# Patient Record
Sex: Female | Born: 1980 | Race: Black or African American | Hispanic: No | Marital: Married | Smoking: Never smoker
Health system: Southern US, Community
[De-identification: ages and names within clinical notes are randomized; demographics above are authoritative.]

## PROBLEM LIST (undated history)

## (undated) DIAGNOSIS — O1490 Unspecified pre-eclampsia, unspecified trimester: Secondary | ICD-10-CM

## (undated) DIAGNOSIS — A491 Streptococcal infection, unspecified site: Secondary | ICD-10-CM

## (undated) DIAGNOSIS — R001 Bradycardia, unspecified: Secondary | ICD-10-CM

## (undated) DIAGNOSIS — Z8669 Personal history of other diseases of the nervous system and sense organs: Secondary | ICD-10-CM

## (undated) DIAGNOSIS — D649 Anemia, unspecified: Secondary | ICD-10-CM

## (undated) DIAGNOSIS — Z8619 Personal history of other infectious and parasitic diseases: Secondary | ICD-10-CM

## (undated) DIAGNOSIS — O119 Pre-existing hypertension with pre-eclampsia, unspecified trimester: Secondary | ICD-10-CM

## (undated) HISTORY — DX: Anemia, unspecified: D64.9

## (undated) HISTORY — PX: NO PAST SURGERIES: SHX2092

## (undated) HISTORY — DX: Personal history of other diseases of the nervous system and sense organs: Z86.69

## (undated) HISTORY — DX: Personal history of other infectious and parasitic diseases: Z86.19

## (undated) HISTORY — DX: Streptococcal infection, unspecified site: A49.1

## (undated) HISTORY — DX: Pre-existing hypertension with pre-eclampsia, unspecified trimester: O11.9

---

## 2002-01-11 ENCOUNTER — Other Ambulatory Visit: Admission: RE | Admit: 2002-01-11 | Discharge: 2002-01-11 | Payer: Self-pay | Admitting: Obstetrics and Gynecology

## 2002-11-27 ENCOUNTER — Inpatient Hospital Stay (HOSPITAL_COMMUNITY): Admission: AD | Admit: 2002-11-27 | Discharge: 2002-11-29 | Payer: Self-pay | Admitting: Obstetrics and Gynecology

## 2003-08-06 ENCOUNTER — Other Ambulatory Visit: Admission: RE | Admit: 2003-08-06 | Discharge: 2003-08-06 | Payer: Self-pay | Admitting: Obstetrics and Gynecology

## 2004-09-13 ENCOUNTER — Other Ambulatory Visit: Admission: RE | Admit: 2004-09-13 | Discharge: 2004-09-13 | Payer: Self-pay | Admitting: Obstetrics and Gynecology

## 2005-11-04 ENCOUNTER — Other Ambulatory Visit: Admission: RE | Admit: 2005-11-04 | Discharge: 2005-11-04 | Payer: Self-pay | Admitting: Obstetrics and Gynecology

## 2005-12-05 DIAGNOSIS — A491 Streptococcal infection, unspecified site: Secondary | ICD-10-CM

## 2005-12-05 HISTORY — DX: Streptococcal infection, unspecified site: A49.1

## 2006-05-15 ENCOUNTER — Inpatient Hospital Stay (HOSPITAL_COMMUNITY): Admission: RE | Admit: 2006-05-15 | Discharge: 2006-05-17 | Payer: Self-pay | Admitting: Obstetrics and Gynecology

## 2006-06-10 ENCOUNTER — Emergency Department (HOSPITAL_COMMUNITY): Admission: EM | Admit: 2006-06-10 | Discharge: 2006-06-10 | Payer: Self-pay | Admitting: Emergency Medicine

## 2009-03-26 ENCOUNTER — Encounter: Admission: RE | Admit: 2009-03-26 | Discharge: 2009-03-26 | Payer: Self-pay | Admitting: Internal Medicine

## 2009-08-28 ENCOUNTER — Emergency Department (HOSPITAL_COMMUNITY): Admission: EM | Admit: 2009-08-28 | Discharge: 2009-08-29 | Payer: Self-pay | Admitting: Emergency Medicine

## 2009-12-29 ENCOUNTER — Emergency Department (HOSPITAL_COMMUNITY): Admission: EM | Admit: 2009-12-29 | Discharge: 2009-12-29 | Payer: Self-pay | Admitting: Emergency Medicine

## 2010-10-07 ENCOUNTER — Inpatient Hospital Stay (HOSPITAL_COMMUNITY): Admission: AD | Admit: 2010-10-07 | Discharge: 2010-10-07 | Payer: Self-pay | Admitting: Obstetrics and Gynecology

## 2010-10-22 ENCOUNTER — Inpatient Hospital Stay (HOSPITAL_COMMUNITY)
Admission: AD | Admit: 2010-10-22 | Discharge: 2010-10-22 | Payer: Self-pay | Source: Home / Self Care | Admitting: Obstetrics and Gynecology

## 2010-11-01 ENCOUNTER — Inpatient Hospital Stay (HOSPITAL_COMMUNITY)
Admission: AD | Admit: 2010-11-01 | Discharge: 2010-11-01 | Payer: Self-pay | Source: Home / Self Care | Admitting: Obstetrics and Gynecology

## 2010-11-05 ENCOUNTER — Inpatient Hospital Stay (HOSPITAL_COMMUNITY)
Admission: AD | Admit: 2010-11-05 | Discharge: 2010-11-07 | Payer: Self-pay | Source: Home / Self Care | Admitting: Obstetrics and Gynecology

## 2010-11-10 ENCOUNTER — Inpatient Hospital Stay (HOSPITAL_COMMUNITY)
Admission: AD | Admit: 2010-11-10 | Discharge: 2010-11-13 | Payer: Self-pay | Source: Home / Self Care | Attending: Obstetrics and Gynecology | Admitting: Obstetrics and Gynecology

## 2010-12-15 ENCOUNTER — Emergency Department (HOSPITAL_COMMUNITY)
Admission: EM | Admit: 2010-12-15 | Discharge: 2010-12-15 | Payer: Self-pay | Source: Home / Self Care | Admitting: Family Medicine

## 2010-12-20 LAB — HEPATIC FUNCTION PANEL
ALT: 22 U/L (ref 0–35)
AST: 22 U/L (ref 0–37)
Albumin: 3.9 g/dL (ref 3.5–5.2)
Alkaline Phosphatase: 68 U/L (ref 39–117)
Bilirubin, Direct: 0.1 mg/dL (ref 0.0–0.3)
Indirect Bilirubin: 0.7 mg/dL (ref 0.3–0.9)
Total Bilirubin: 0.8 mg/dL (ref 0.3–1.2)
Total Protein: 6.5 g/dL (ref 6.0–8.3)

## 2010-12-20 LAB — POCT I-STAT, CHEM 8
BUN: <3 mg/dL — ABNORMAL LOW (ref 6–23)
Calcium, Ion: 1.21 mmol/L (ref 1.12–1.32)
Chloride: 107 mEq/L (ref 96–112)
Creatinine, Ser: 0.9 mg/dL (ref 0.4–1.2)
Glucose, Bld: 83 mg/dL (ref 70–99)
HCT: 40 % (ref 36.0–46.0)
Hemoglobin: 13.6 g/dL (ref 12.0–15.0)
Potassium: 3.9 mEq/L (ref 3.5–5.1)
Sodium: 140 mEq/L (ref 135–145)
TCO2: 27 mmol/L (ref 0–100)

## 2010-12-20 LAB — POCT URINALYSIS DIPSTICK
Bilirubin Urine: NEGATIVE
Hgb urine dipstick: NEGATIVE
Ketones, ur: 15 mg/dL — AB
Nitrite: NEGATIVE
Protein, ur: NEGATIVE mg/dL
Specific Gravity, Urine: 1.015 (ref 1.005–1.030)
Urine Glucose, Fasting: NEGATIVE mg/dL
Urobilinogen, UA: 0.2 mg/dL (ref 0.0–1.0)
pH: 5.5 (ref 5.0–8.0)

## 2010-12-26 ENCOUNTER — Encounter: Payer: Self-pay | Admitting: Internal Medicine

## 2011-01-01 ENCOUNTER — Emergency Department (HOSPITAL_COMMUNITY)
Admission: EM | Admit: 2011-01-01 | Discharge: 2011-01-02 | Payer: Self-pay | Source: Home / Self Care | Admitting: Emergency Medicine

## 2011-01-01 LAB — CBC
HCT: 36.4 % (ref 36.0–46.0)
Hemoglobin: 11.5 g/dL — ABNORMAL LOW (ref 12.0–15.0)
MCH: 22 pg — ABNORMAL LOW (ref 26.0–34.0)
MCHC: 31.6 g/dL (ref 30.0–36.0)
MCV: 69.6 fL — ABNORMAL LOW (ref 78.0–100.0)
Platelets: 247 10*3/uL (ref 150–400)
RBC: 5.23 MIL/uL — ABNORMAL HIGH (ref 3.87–5.11)
RDW: 16.5 % — ABNORMAL HIGH (ref 11.5–15.5)
WBC: 6 10*3/uL (ref 4.0–10.5)

## 2011-01-02 LAB — URINE MICROSCOPIC-ADD ON

## 2011-01-02 LAB — BASIC METABOLIC PANEL
BUN: 3 mg/dL — ABNORMAL LOW (ref 6–23)
CO2: 22 mEq/L (ref 19–32)
Calcium: 9.4 mg/dL (ref 8.4–10.5)
Chloride: 109 mEq/L (ref 96–112)
Creatinine, Ser: 0.75 mg/dL (ref 0.4–1.2)
GFR calc Af Amer: >60 mL/min (ref >60)
GFR calc non Af Amer: >60 mL/min (ref >60)
Glucose, Bld: 84 mg/dL (ref 70–99)
Potassium: 3.9 mEq/L (ref 3.5–5.1)
Sodium: 141 mEq/L (ref 135–145)

## 2011-01-02 LAB — URINALYSIS, ROUTINE W REFLEX MICROSCOPIC
Bilirubin Urine: NEGATIVE
Ketones, ur: 15 mg/dL — AB
Leukocytes, UA: NEGATIVE
Nitrite: NEGATIVE
Protein, ur: NEGATIVE mg/dL
Specific Gravity, Urine: 1.008 (ref 1.005–1.030)
Urine Glucose, Fasting: NEGATIVE mg/dL
Urobilinogen, UA: 0.2 mg/dL (ref 0.0–1.0)
pH: 6.5 (ref 5.0–8.0)

## 2011-01-02 LAB — HEPATIC FUNCTION PANEL
ALT: 13 U/L (ref 0–35)
AST: 20 U/L (ref 0–37)
Albumin: 3.7 g/dL (ref 3.5–5.2)
Alkaline Phosphatase: 59 U/L (ref 39–117)
Bilirubin, Direct: 0.2 mg/dL (ref 0.0–0.3)
Indirect Bilirubin: 0.6 mg/dL (ref 0.3–0.9)
Total Bilirubin: 0.8 mg/dL (ref 0.3–1.2)
Total Protein: 6.2 g/dL (ref 6.0–8.3)

## 2011-01-02 LAB — DIFFERENTIAL
Basophils Absolute: 0 10*3/uL (ref 0.0–0.1)
Basophils Relative: 0 % (ref 0–1)
Eosinophils Absolute: 0.1 10*3/uL (ref 0.0–0.7)
Eosinophils Relative: 1 % (ref 0–5)
Lymphocytes Relative: 35 % (ref 12–46)
Lymphs Abs: 2.1 10*3/uL (ref 0.7–4.0)
Monocytes Absolute: 0.4 10*3/uL (ref 0.1–1.0)
Monocytes Relative: 6 % (ref 3–12)
Neutro Abs: 3.4 10*3/uL (ref 1.7–7.7)
Neutrophils Relative %: 58 % (ref 43–77)

## 2011-01-02 LAB — D-DIMER, QUANTITATIVE: D-Dimer, Quant: <0.22 ug/mL-FEU (ref 0.00–0.48)

## 2011-01-02 LAB — MAGNESIUM: Magnesium: 2.1 mg/dL (ref 1.5–2.5)

## 2011-01-11 NOTE — Discharge Summary (Signed)
  NAMEKaelani Curry, Michelle Curry                 ACCOUNT NO.:  [id number]  MEDICAL RECORD NO.:  [id number]          PATIENT TYPE:  INP  LOCATION:  9312                          FACILITY:  [facility]  PHYSICIAN:  Janine Limbo, M.D.DATE OF BIRTH:  10-04-1981  DATE OF ADMISSION:  11/10/2010 DATE OF DISCHARGE:  11/13/2010                              DISCHARGE SUMMARY   ADMITTING DIAGNOSES:  Status post spontaneous vaginal delivery 5 days ago, increased LFTs, edema, hyperreflexia, and mild increase in blood pressure.  DISCHARGE DIAGNOSES:  Postpartum preeclampsia questionable chronic hypertension, elevated uric acid, elevated ALT.  HOSPITAL PROCEDURES:  The patient was admitted to AICU per consult with Dr. Estanislado Pandy.  Magnesium sulfate 4 grams bolus and 2 grams an hour times 24 hours.  PIH labs were followed.  Strict I and O's and daily weights. blood pressure monitored.  CONDITION ON DISCHARGE:  The patient was stable.  DISCHARGE INSTRUCTIONS:  Follow up in office in 1 week, monitored for PIH symptoms.  DISCHARGE MEDICATIONS:  Motrin 600 mg, Dulcolax p.r.n., and prenatal vitamins daily.  DISCHARGE FOLLOWUP:  As stated above.  The patient was deemed to receive full benefit of her hospital stay and was discharged home in stable condition.    ______________________________ Sanda Klein, CNM   ______________________________ Janine Limbo, M.D.    SL/MEDQ  D:  01/05/2011  T:  01/06/2011  Job:  [id number]  Electronically Signed by Sanda Klein CNM on 01/10/2011 08:30:05 AM Electronically Signed by Kirkland Hun M.D. on 01/11/2011 09:58:29 PM

## 2011-02-14 LAB — COMPREHENSIVE METABOLIC PANEL
ALT: 139 U/L — ABNORMAL HIGH (ref 0–35)
ALT: 163 U/L — ABNORMAL HIGH (ref 0–35)
ALT: 185 U/L — ABNORMAL HIGH (ref 0–35)
AST: 109 U/L — ABNORMAL HIGH (ref 0–37)
AST: 128 U/L — ABNORMAL HIGH (ref 0–37)
Albumin: 2.5 g/dL — ABNORMAL LOW (ref 3.5–5.2)
Albumin: 2.6 g/dL — ABNORMAL LOW (ref 3.5–5.2)
Albumin: 3.1 g/dL — ABNORMAL LOW (ref 3.5–5.2)
Alkaline Phosphatase: 100 U/L (ref 39–117)
Alkaline Phosphatase: 106 U/L (ref 39–117)
Alkaline Phosphatase: 115 U/L (ref 39–117)
BUN: 10 mg/dL (ref 6–23)
BUN: 7 mg/dL (ref 6–23)
BUN: 8 mg/dL (ref 6–23)
CO2: 23 mEq/L (ref 19–32)
CO2: 25 mEq/L (ref 19–32)
Calcium: 8 mg/dL — ABNORMAL LOW (ref 8.4–10.5)
Calcium: 8.8 mg/dL (ref 8.4–10.5)
Chloride: 107 mEq/L (ref 96–112)
Chloride: 107 mEq/L (ref 96–112)
Chloride: 107 mEq/L (ref 96–112)
Creatinine, Ser: 0.66 mg/dL (ref 0.4–1.2)
GFR calc Af Amer: >60 mL/min (ref >60)
GFR calc non Af Amer: >60 mL/min (ref >60)
GFR calc non Af Amer: >60 mL/min (ref >60)
Glucose, Bld: 131 mg/dL — ABNORMAL HIGH (ref 70–99)
Glucose, Bld: 74 mg/dL (ref 70–99)
Glucose, Bld: 84 mg/dL (ref 70–99)
Potassium: 3.7 mEq/L (ref 3.5–5.1)
Potassium: 4.1 mEq/L (ref 3.5–5.1)
Potassium: 4.2 mEq/L (ref 3.5–5.1)
Sodium: 136 mEq/L (ref 135–145)
Sodium: 138 mEq/L (ref 135–145)
Sodium: 138 mEq/L (ref 135–145)
Total Bilirubin: 0.3 mg/dL (ref 0.3–1.2)
Total Bilirubin: 0.4 mg/dL (ref 0.3–1.2)
Total Protein: 5.1 g/dL — ABNORMAL LOW (ref 6.0–8.3)
Total Protein: 5.1 g/dL — ABNORMAL LOW (ref 6.0–8.3)
Total Protein: 5.6 g/dL — ABNORMAL LOW (ref 6.0–8.3)

## 2011-02-14 LAB — CBC
HCT: 30 % — ABNORMAL LOW (ref 36.0–46.0)
HCT: 30.6 % — ABNORMAL LOW (ref 36.0–46.0)
HCT: 32.8 % — ABNORMAL LOW (ref 36.0–46.0)
Hemoglobin: 10.4 g/dL — ABNORMAL LOW (ref 12.0–15.0)
Hemoglobin: 9.5 g/dL — ABNORMAL LOW (ref 12.0–15.0)
Hemoglobin: 9.7 g/dL — ABNORMAL LOW (ref 12.0–15.0)
MCH: 22.3 pg — ABNORMAL LOW (ref 26.0–34.0)
MCH: 22.5 pg — ABNORMAL LOW (ref 26.0–34.0)
MCH: 22.7 pg — ABNORMAL LOW (ref 26.0–34.0)
MCHC: 31.6 g/dL (ref 30.0–36.0)
MCHC: 31.7 g/dL (ref 30.0–36.0)
MCHC: 31.8 g/dL (ref 30.0–36.0)
MCV: 70.6 fL — ABNORMAL LOW (ref 78.0–100.0)
MCV: 70.7 fL — ABNORMAL LOW (ref 78.0–100.0)
MCV: 71.4 fL — ABNORMAL LOW (ref 78.0–100.0)
MCV: 71.9 fL — ABNORMAL LOW (ref 78.0–100.0)
Platelets: 141 10*3/uL — ABNORMAL LOW (ref 150–400)
Platelets: 159 10*3/uL (ref 150–400)
Platelets: 228 10*3/uL (ref 150–400)
Platelets: 246 10*3/uL (ref 150–400)
Platelets: 294 10*3/uL (ref 150–400)
RBC: 4.24 MIL/uL (ref 3.87–5.11)
RBC: 4.24 MIL/uL (ref 3.87–5.11)
RBC: 4.28 MIL/uL (ref 3.87–5.11)
RBC: 4.64 MIL/uL (ref 3.87–5.11)
RDW: 16 % — ABNORMAL HIGH (ref 11.5–15.5)
RDW: 16.4 % — ABNORMAL HIGH (ref 11.5–15.5)
RDW: 17.3 % — ABNORMAL HIGH (ref 11.5–15.5)
RDW: 17.5 % — ABNORMAL HIGH (ref 11.5–15.5)
RDW: 18 % — ABNORMAL HIGH (ref 11.5–15.5)
WBC: 10.2 10*3/uL (ref 4.0–10.5)
WBC: 10.3 10*3/uL (ref 4.0–10.5)
WBC: 11 10*3/uL — ABNORMAL HIGH (ref 4.0–10.5)
WBC: 15.3 10*3/uL — ABNORMAL HIGH (ref 4.0–10.5)
WBC: 8.6 10*3/uL (ref 4.0–10.5)

## 2011-02-14 LAB — URINALYSIS, MICROSCOPIC ONLY
Bilirubin Urine: NEGATIVE
Glucose, UA: NEGATIVE mg/dL
Hgb urine dipstick: NEGATIVE
Ketones, ur: NEGATIVE mg/dL
Leukocytes, UA: NEGATIVE
Nitrite: NEGATIVE
Protein, ur: NEGATIVE mg/dL
Specific Gravity, Urine: 1.01 (ref 1.005–1.030)
Urobilinogen, UA: 0.2 mg/dL (ref 0.0–1.0)
pH: 5.5 (ref 5.0–8.0)

## 2011-02-14 LAB — URIC ACID
Uric Acid, Serum: 6.8 mg/dL (ref 2.4–7.0)
Uric Acid, Serum: 7.1 mg/dL — ABNORMAL HIGH (ref 2.4–7.0)
Uric Acid, Serum: 7.1 mg/dL — ABNORMAL HIGH (ref 2.4–7.0)

## 2011-02-14 LAB — RPR: RPR Ser Ql: NONREACTIVE

## 2011-02-14 LAB — LACTATE DEHYDROGENASE: LDH: 275 U/L — ABNORMAL HIGH (ref 94–250)

## 2011-02-14 LAB — MRSA PCR SCREENING: MRSA by PCR: NEGATIVE

## 2011-02-20 LAB — COMPREHENSIVE METABOLIC PANEL
ALT: 15 U/L (ref 0–35)
BUN: 4 mg/dL — ABNORMAL LOW (ref 6–23)
CO2: 23 mEq/L (ref 19–32)
Calcium: 9.5 mg/dL (ref 8.4–10.5)
Creatinine, Ser: 0.72 mg/dL (ref 0.4–1.2)
GFR calc non Af Amer: >60 mL/min (ref >60)
Glucose, Bld: 78 mg/dL (ref 70–99)
Sodium: 137 mEq/L (ref 135–145)
Total Protein: 6.8 g/dL (ref 6.0–8.3)

## 2011-02-20 LAB — URINALYSIS, ROUTINE W REFLEX MICROSCOPIC
Nitrite: NEGATIVE
Specific Gravity, Urine: 1.009 (ref 1.005–1.030)
pH: 6 (ref 5.0–8.0)

## 2011-02-20 LAB — HEMOCCULT GUIAC POC 1CARD (OFFICE): Fecal Occult Bld: NEGATIVE

## 2011-02-20 LAB — DIFFERENTIAL
Eosinophils Absolute: 0 10*3/uL (ref 0.0–0.7)
Lymphs Abs: 2.6 10*3/uL (ref 0.7–4.0)
Monocytes Relative: 5 % (ref 3–12)
Neutro Abs: 6.4 10*3/uL (ref 1.7–7.7)
Neutrophils Relative %: 67 % (ref 43–77)

## 2011-02-20 LAB — CBC
HCT: 36.8 % (ref 36.0–46.0)
Hemoglobin: 12 g/dL (ref 12.0–15.0)
MCHC: 32.5 g/dL (ref 30.0–36.0)
MCV: 73.7 fL — ABNORMAL LOW (ref 78.0–100.0)
RDW: 13.8 % (ref 11.5–15.5)
WBC: 9.6 10*3/uL (ref 4.0–10.5)

## 2011-02-20 LAB — POCT PREGNANCY, URINE: Preg Test, Ur: NEGATIVE

## 2011-02-20 LAB — LIPASE, BLOOD: Lipase: 19 U/L (ref 11–59)

## 2011-04-22 NOTE — H&P (Signed)
NAMEHanifah Curry, Michelle Curry                 ACCOUNT NO.:  [id number]   MEDICAL RECORD NO.:  [id number]          PATIENT TYPE:  INP   LOCATION:  9165                          [facility]ILITY:  [facility]   PHYSICIAN:  Dois Davenport A. Rivard, M.D. DATE OF BIRTH:  Apr 26, 1981   DATE OF ADMISSION:  05/15/2006  DATE OF DISCHARGE:                                HISTORY & PHYSICAL   Michelle Curry  is a 30 year old gravida 2, para 1-0-0-1 who presents at 38-6/7  weeks with advanced cervical dilation for elective induction of labor.  Her  pregnancy has been followed by the MD service and is remarkable for  1.  Heart murmur.  2.  Migraine headaches.  3.  Group B strep positive.   Michelle Curry entered prenatal care at [redacted] weeks gestation as determined by  dates and confirmed with pregnancy ultrasound.  Her pregnancy has been  essentially unremarkable.  She has been size equal to dates throughout,  normotensive with no proteinuria.  Her last ultrasound was at 36 weeks with  estimated fetal weight at the 84th percentile.   PRENATAL LAB WORK:  On November 04, 2005, hemoglobin and hematocrit 12 and  37.3, platelets 304,000.  Blood type and Rh A+, antibody screen negative,  sickle cell trait negative, VDRL nonreactive, rubella immune, hepatitis B  surface antigen negative, HIV nonreactive.  Pap smear within normal limits.  GC and chlamydia negative.  CF testing negative.  Quad screen negative at 28  weeks.  One hour glucose challenge test was within normal limits.  Hemoglobin 11.5 at 36 weeks.  Culture of the vaginal tract is positive for  group B Strep, negative for GC and chlamydia.   OBSTETRIC HISTORY:  In 2003, the patient had a normal spontaneous vaginal  delivery at term with the birth of an 8 pound 1 ounce female infant named  Michelle Curry with no complications.  This is her second and current pregnancy.   MEDICAL HISTORY:  1.  Heart murmur with dental prophylaxis.  2.  Migraine headaches.   FAMILY HISTORY:   Unremarkable.   GENETIC HISTORY:  There is no family history of familial or chromosomal  disorders, children that were born with any birth defects or any that died  in infancy.   ALLERGIES:  The patient has no known drug allergies.   She denies the use of tobacco, alcohol or illicit drugs.   REVIEW OF SYSTEMS:  There are no signs or symptoms suggestive of focal or  systemic disease and the patient is typical of one with the uterine  pregnancy at term for induction of labor with advanced cervical dilation.   PHYSICAL EXAMINATION:  VITAL SIGNS:  Stable.  The patient is afebrile.  HEENT:  Unremarkable.  HEART:  Regular rate and rhythm.  LUNGS:  Clear.  ABDOMEN:  Abdomen is gravid in its contour.  Uterine fundus is noted to  extend 38 cm above the level of the pubic symphysis.  Leopold's maneuvers  finds the infant to be a longitudinal lie, cephalic presentation and the  estimated fetal weight is 7-1/2 to 8 pounds.  PELVIC:  Digital exam of the cervix finds it to be 4 centimeters dilated 75%  effaced with the cephalic presenting part at -2 station  EXTREMITIES:  No pathologic edema.  DTRs were 1+ with no clonus.  There is  no calf tenderness bilaterally.   ASSESSMENT:  1.  Intrauterine pregnancy at term.  2.  Induction of labor with advanced cervical dilation.   PLAN:  Admit per Dr. Silverio Lay, routine MD orders and orders as written  by Dr. Estanislado Pandy.  Anticipate spontaneous vaginal delivery.      Rica Koyanagi, C.N.M.      Crist Fat Rivard, M.D.  Electronically Signed    SDM/MEDQ  D:  05/15/2006  T:  05/15/2006  Job:  [id number]

## 2011-04-22 NOTE — H&P (Signed)
NAME:  Janera, Peugh Madalyne C                           ACCOUNT NO.:  [id number]   MEDICAL RECORD NO.:  [id number]                   PATIENT TYPE:  MAT   LOCATION:  MATC                                 [facility]ILITY:  [facility]   PHYSICIAN:  Crist Fat. Rivard, M.D.              DATE OF BIRTH:  [birth year]   DATE OF ADMISSION:  11/27/2002  DATE OF DISCHARGE:                                HISTORY & PHYSICAL   HISTORY OF PRESENT ILLNESS:  The patient is a 30 year old gravida 1, para 0  at 39-1/7th weeks who presents today with uterine contractions every two to  three minutes for the last several hours.  She denies vaginal bleeding or  leaking of fluid.  She reports positive fetal movement.  Pregnancy has been  remarkable for:  1. First trimester spotting.  2. History of heart murmur with prophylactic antibiotics for dental work.  3. Conception on OCP's.  4. History of migraines.   LABORATORY DATA:  Prenatal labs show blood type A positive, Rh antibody  negative, VDRL nonreactive, Rubella titer positive, hepatitis B surface  antigen negative, HIV negative, sickle cell test negative, GC and Chlamydia  cultures were negative.  Pap smear was normal.  Glucose challenge was  normal.  AFP was normal.  Hemoglobin upon entering the practice was 12.7 and  was 11.5 at 28 weeks.  B Strep culture was negative at 36 weeks.  [facility]  December 03, 2002 was established by ultrasound at 8 weeks.   HISTORY OF PRESENT PREGNANCY:  Patient entered care at approximately 9  weeks.  She had an ultrasound that dated her at approximately 8 weeks.  She  had conceived on OCP's and there was uncertain LMP data.  She had some  bleeding early.  She had an ultrasound at 18 weeks that showed normal growth  and development.  Her Glucola was normal.  She had an impacted and infected  wisdom tooth that was treated by her dentist at 28 weeks.  The rest of her  pregnancy was essentially uncomplicated.  She was 2 cm in the office  yesterday.   OBSTETRICAL HISTORY:  Patient is primigravida.   PAST MEDICAL HISTORY:  Patient has a history of heart murmur, takes  antibiotics for dental work.  The patient has a history of anemia three  years ago.  She has had occasional yeast infections.  She conceived on  OCP's.  She reports usual childhood illnesses.   ALLERGIES:  Patient has no known allergies.   FAMILY HISTORY:  Mother has history of anemia.  Her father has a history of  migraine headaches. Genetic history remarkable for patient's first cousin  having twins and father of the baby's paternal aunt is a twin.   SOCIAL HISTORY:  Patient is married to the father of the baby.  He is  involved and supportive.  His name is Leomia Blake.  The patient is  African-American female and of Hollye Pritt.  She has been followed by  the physician service of [facility].  She denies any alcohol, drug  or tobacco use during this pregnancy.  She has 14+ years, college, is  employed in [employer].  Her partner has one year of college.  He is employed in a [employer].   PHYSICAL EXAMINATION:  VITAL SIGNS:  Stable.  Patient is afebrile.  HEENT:  Within normal limits.  LUNGS:  Bilateral breath sounds are clear.  HEART:  Regular rate and rhythm without murmurs.  BREASTS:  Soft, nontender.  ABDOMEN:  Fundal height is approximately 38 cm.  Estimated fetal weight is 7  to 7.5 pounds.  Uterine contractions every two to three minutes.  Moderate  quality.  Cervical examination is 3 to 4 cm, 80% vertex, at a -1 station  with intact bag of water.  Fetal heart rate is reassuring with a negative  spontaneous CST.  There are occasional mild variables noted.  EXTREMITIES:  Deep tendon reflexes are 2+ without clonus.  There is a trace  edema noted.   IMPRESSION:  1. Intrauterine pregnancy at 39-1/7th weeks.  2. Early labor.   PLAN:  1. Admit to birthing suite for consultation with Dr. Silverio Lay as     attending  physician.  2. Routine physician orders.  3. Patient plans epidural as labor progresses.     Renaldo Reel Emilee Hero, C.N.M.                   Crist Fat Rivard, M.D.    Leeanne Mannan  D:  11/27/2002  T:  11/27/2002  Job:  [id number]

## 2011-08-31 ENCOUNTER — Other Ambulatory Visit: Payer: Self-pay | Admitting: Oncology

## 2011-08-31 ENCOUNTER — Encounter: Payer: Self-pay | Admitting: Oncology

## 2011-08-31 ENCOUNTER — Encounter (HOSPITAL_BASED_OUTPATIENT_CLINIC_OR_DEPARTMENT_OTHER): Payer: BC Managed Care – PPO | Admitting: Oncology

## 2011-08-31 DIAGNOSIS — D6489 Other specified anemias: Secondary | ICD-10-CM

## 2011-08-31 LAB — COMPREHENSIVE METABOLIC PANEL
Albumin: 3.9 g/dL (ref 3.5–5.2)
BUN: 8 mg/dL (ref 6–23)
Calcium: 9.4 mg/dL (ref 8.4–10.5)
Chloride: 105 mEq/L (ref 96–112)
Creatinine, Ser: 0.66 mg/dL (ref 0.50–1.10)
Glucose, Bld: 85 mg/dL (ref 70–99)
Potassium: 3.8 mEq/L (ref 3.5–5.3)

## 2011-08-31 LAB — IRON AND TIBC
Iron: 37 ug/dL — ABNORMAL LOW (ref 42–145)
TIBC: 285 ug/dL (ref 250–470)
UIBC: 248 ug/dL (ref 125–400)

## 2011-08-31 LAB — CBC & DIFF AND RETIC
BASO%: 0.1 % (ref 0.0–2.0)
EOS%: 0.5 % (ref 0.0–7.0)
Immature Retic Fract: 3.4 % (ref 1.60–10.00)
MCHC: 31.9 g/dL (ref 31.5–36.0)
MONO#: 0.5 10*3/uL (ref 0.1–0.9)
RBC: 5.09 10*6/uL (ref 3.70–5.45)
Retic %: 1.68 % (ref 0.70–2.10)
WBC: 8.9 10*3/uL (ref 3.9–10.3)
lymph#: 1.7 10*3/uL (ref 0.9–3.3)

## 2011-08-31 LAB — MORPHOLOGY

## 2011-08-31 LAB — CHCC SMEAR

## 2011-09-02 LAB — HEMOGLOBINOPATHY EVALUATION
Hemoglobin Other: 0 % (ref 0.0–0.0)
Hgb F Quant: 0 % (ref 0.0–2.0)
Hgb S Quant: 0 % (ref 0.0–0.0)

## 2011-10-28 ENCOUNTER — Other Ambulatory Visit: Payer: BC Managed Care – PPO | Admitting: Lab

## 2011-12-06 NOTE — L&D Delivery Note (Signed)
Delivery Note Pt received epidural shortly after arrival to [facility].  AROM at 1116 and pt 8-9 cm.  Labored progressed thereafter to complete at 1129.  Pushed well to SVD at 11:49 AM. A viable female "Hulan Amato" was delivered via Vaginal, Spontaneous Delivery (Presentation: ;ROA  ).  APGAR: 9, 9; weight 8 lb 7.1 oz (3830 g).   Placenta status: Intact, Spontaneous, Schultz.  Cord: 3 vessels with the following complications: None.  Cord pH: n/a.  Anesthesia: Epidural  Episiotomy: None Lacerations: None Suture Repair: n/a Est. Blood Loss (mL): 150  Mom to postpartum.  Baby to nursery-stable.  Eron Staat H 06/13/2012, 5:46 PM

## 2011-12-07 DIAGNOSIS — Q688 Other specified congenital musculoskeletal deformities: Secondary | ICD-10-CM | POA: Insufficient documentation

## 2011-12-07 DIAGNOSIS — R011 Cardiac murmur, unspecified: Secondary | ICD-10-CM | POA: Insufficient documentation

## 2011-12-07 DIAGNOSIS — G43909 Migraine, unspecified, not intractable, without status migrainosus: Secondary | ICD-10-CM | POA: Insufficient documentation

## 2011-12-07 LAB — OB RESULTS CONSOLE RPR: RPR: NONREACTIVE

## 2011-12-07 LAB — OB RESULTS CONSOLE HIV ANTIBODY (ROUTINE TESTING): HIV: NONREACTIVE

## 2011-12-07 LAB — OB RESULTS CONSOLE ABO/RH: RH Type: POSITIVE

## 2011-12-07 LAB — OB RESULTS CONSOLE HEPATITIS B SURFACE ANTIGEN: Hepatitis B Surface Ag: NEGATIVE

## 2011-12-08 DIAGNOSIS — K469 Unspecified abdominal hernia without obstruction or gangrene: Secondary | ICD-10-CM | POA: Insufficient documentation

## 2012-02-02 ENCOUNTER — Encounter (INDEPENDENT_AMBULATORY_CARE_PROVIDER_SITE_OTHER): Payer: BC Managed Care – PPO | Admitting: Obstetrics and Gynecology

## 2012-02-02 ENCOUNTER — Other Ambulatory Visit: Payer: BC Managed Care – PPO

## 2012-02-02 DIAGNOSIS — Z1389 Encounter for screening for other disorder: Secondary | ICD-10-CM

## 2012-03-08 ENCOUNTER — Encounter (INDEPENDENT_AMBULATORY_CARE_PROVIDER_SITE_OTHER): Payer: BC Managed Care – PPO | Admitting: Obstetrics and Gynecology

## 2012-03-08 DIAGNOSIS — O358XX0 Maternal care for other (suspected) fetal abnormality and damage, not applicable or unspecified: Secondary | ICD-10-CM

## 2012-03-09 ENCOUNTER — Other Ambulatory Visit: Payer: Self-pay

## 2012-03-09 DIAGNOSIS — Z3400 Encounter for supervision of normal first pregnancy, unspecified trimester: Secondary | ICD-10-CM

## 2012-04-04 DIAGNOSIS — Q688 Other specified congenital musculoskeletal deformities: Secondary | ICD-10-CM

## 2012-04-04 DIAGNOSIS — D649 Anemia, unspecified: Secondary | ICD-10-CM | POA: Insufficient documentation

## 2012-04-04 DIAGNOSIS — G43909 Migraine, unspecified, not intractable, without status migrainosus: Secondary | ICD-10-CM

## 2012-04-04 DIAGNOSIS — K469 Unspecified abdominal hernia without obstruction or gangrene: Secondary | ICD-10-CM

## 2012-04-04 DIAGNOSIS — R011 Cardiac murmur, unspecified: Secondary | ICD-10-CM

## 2012-04-05 ENCOUNTER — Ambulatory Visit (INDEPENDENT_AMBULATORY_CARE_PROVIDER_SITE_OTHER): Payer: Self-pay | Admitting: Obstetrics and Gynecology

## 2012-04-05 ENCOUNTER — Other Ambulatory Visit: Payer: Self-pay

## 2012-04-05 ENCOUNTER — Encounter: Payer: Self-pay | Admitting: Obstetrics and Gynecology

## 2012-04-05 VITALS — BP 100/62 | Ht 63.0 in | Wt 176.0 lb

## 2012-04-05 DIAGNOSIS — O26849 Uterine size-date discrepancy, unspecified trimester: Secondary | ICD-10-CM

## 2012-04-05 DIAGNOSIS — Z3490 Encounter for supervision of normal pregnancy, unspecified, unspecified trimester: Secondary | ICD-10-CM

## 2012-04-05 DIAGNOSIS — Z3480 Encounter for supervision of other normal pregnancy, unspecified trimester: Secondary | ICD-10-CM

## 2012-04-05 NOTE — Progress Notes (Signed)
No complaints U/s for EFW at [facility] secondary S>D Glucola today [facility]

## 2012-04-06 LAB — CBC
Hemoglobin: 11.8 g/dL — ABNORMAL LOW (ref 12.0–15.0)
MCH: 23.2 pg — ABNORMAL LOW (ref 26.0–34.0)
MCHC: 30.8 g/dL (ref 30.0–36.0)
MCV: 75.4 fL — ABNORMAL LOW (ref 78.0–100.0)
Platelets: 215 10*3/uL (ref 150–400)

## 2012-04-06 LAB — GLUCOSE TOLERANCE, 1 HOUR: Glucose, 1 Hour GTT: 108 mg/dL (ref 70–140)

## 2012-04-17 ENCOUNTER — Other Ambulatory Visit: Payer: BC Managed Care – PPO

## 2012-04-17 ENCOUNTER — Ambulatory Visit (INDEPENDENT_AMBULATORY_CARE_PROVIDER_SITE_OTHER): Payer: BC Managed Care – PPO | Admitting: Obstetrics and Gynecology

## 2012-04-17 ENCOUNTER — Other Ambulatory Visit: Payer: Self-pay | Admitting: Obstetrics and Gynecology

## 2012-04-17 VITALS — BP 110/64 | Wt 182.0 lb

## 2012-04-17 DIAGNOSIS — O26849 Uterine size-date discrepancy, unspecified trimester: Secondary | ICD-10-CM

## 2012-04-17 DIAGNOSIS — Z3689 Encounter for other specified antenatal screening: Secondary | ICD-10-CM

## 2012-04-17 NOTE — Progress Notes (Signed)
The patient desires permanent sterilization.  Her husband declines vasectomy.  Postpartum BTL discussed. Glucola 108.  Hemoglobin 11.8.  RPR nonreactive. Ultrasound rescheduled for tomorrow.  We will measure the patient's cervix at that time.  She declines pelvic exam today. CNM will discuss ultrasound results with the patient at the hospital tomorrow. Return office in 2 weeks.  Dr. Stefano Gaul

## 2012-04-17 NOTE — Progress Notes (Signed)
Pt c/o of [employer]

## 2012-04-18 ENCOUNTER — Ambulatory Visit (HOSPITAL_COMMUNITY)
Admission: RE | Admit: 2012-04-18 | Discharge: 2012-04-18 | Disposition: A | Discharge status: Home / Self Care | Payer: BC Managed Care – PPO | Source: Ambulatory Visit | Attending: Obstetrics and Gynecology | Admitting: Obstetrics and Gynecology

## 2012-04-18 DIAGNOSIS — Z3689 Encounter for other specified antenatal screening: Secondary | ICD-10-CM | POA: Insufficient documentation

## 2012-04-18 DIAGNOSIS — O3660X0 Maternal care for excessive fetal growth, unspecified trimester, not applicable or unspecified: Secondary | ICD-10-CM | POA: Insufficient documentation

## 2012-05-03 ENCOUNTER — Ambulatory Visit (INDEPENDENT_AMBULATORY_CARE_PROVIDER_SITE_OTHER): Payer: Self-pay | Admitting: Obstetrics and Gynecology

## 2012-05-03 ENCOUNTER — Encounter: Payer: Self-pay | Admitting: Obstetrics and Gynecology

## 2012-05-03 VITALS — BP 110/60 | Wt 187.0 lb

## 2012-05-03 DIAGNOSIS — Z331 Pregnant state, incidental: Secondary | ICD-10-CM

## 2012-05-03 NOTE — Patient Instructions (Signed)
Patient Education Materials to be provided at check out (*indicates is located in accordion folder):  Easing Back Pain During Pregnancy  

## 2012-05-03 NOTE — Progress Notes (Signed)
No LOf or vb fkc reviewed Femur length small recheck at 36 weeks with growth Pt having trouble sleeping and back pain comfort and sleep measures given to pt

## 2012-05-03 NOTE — Progress Notes (Signed)
Pt c/o lower back pain and leg cramps.

## 2012-05-15 ENCOUNTER — Ambulatory Visit (INDEPENDENT_AMBULATORY_CARE_PROVIDER_SITE_OTHER): Payer: Self-pay | Admitting: Obstetrics and Gynecology

## 2012-05-15 VITALS — BP 102/58 | Wt 187.0 lb

## 2012-05-15 DIAGNOSIS — IMO0002 Reserved for concepts with insufficient information to code with codable children: Secondary | ICD-10-CM

## 2012-05-15 DIAGNOSIS — Z331 Pregnant state, incidental: Secondary | ICD-10-CM

## 2012-05-15 NOTE — Progress Notes (Signed)
Pt stated having lower pressure feels like the baby is coming out . No other issues today .

## 2012-05-15 NOTE — Progress Notes (Signed)
C/o increased pressure and contractions Suggest maternity belt S>D ultrasound at [facility]

## 2012-05-22 ENCOUNTER — Ambulatory Visit (INDEPENDENT_AMBULATORY_CARE_PROVIDER_SITE_OTHER): Payer: Self-pay | Admitting: Obstetrics and Gynecology

## 2012-05-22 ENCOUNTER — Ambulatory Visit (INDEPENDENT_AMBULATORY_CARE_PROVIDER_SITE_OTHER): Payer: Self-pay

## 2012-05-22 ENCOUNTER — Encounter: Payer: Self-pay | Admitting: Obstetrics and Gynecology

## 2012-05-22 VITALS — BP 100/60 | Wt 190.0 lb

## 2012-05-22 DIAGNOSIS — IMO0002 Reserved for concepts with insufficient information to code with codable children: Secondary | ICD-10-CM

## 2012-05-22 DIAGNOSIS — Z331 Pregnant state, incidental: Secondary | ICD-10-CM

## 2012-05-22 DIAGNOSIS — O3660X0 Maternal care for excessive fetal growth, unspecified trimester, not applicable or unspecified: Secondary | ICD-10-CM

## 2012-05-22 NOTE — Progress Notes (Addendum)
C/o seeing a spot of blood on Sat U/S 7lbs 13oz (3537) greater 97%, AFI 19cm, FHR 131, vtx, ant placenta, f/u u/s in 3wks for EFW, BPP 8/8 Reviewed LGA nd poss for recs for c/s depending on wt at next u/s in 3wks Spec no blood in vault, GBS/GC/CT today with consent FKCs and diet recs reviewed Questions answered RTO in 1wk

## 2012-05-23 LAB — GC/CHLAMYDIA PROBE AMP, GENITAL: GC Probe Amp, Genital: NEGATIVE

## 2012-05-28 ENCOUNTER — Encounter: Payer: Self-pay | Admitting: Obstetrics and Gynecology

## 2012-05-28 ENCOUNTER — Ambulatory Visit (INDEPENDENT_AMBULATORY_CARE_PROVIDER_SITE_OTHER): Payer: Self-pay | Admitting: Obstetrics and Gynecology

## 2012-05-28 VITALS — BP 100/58 | Wt 191.0 lb

## 2012-05-28 DIAGNOSIS — Z331 Pregnant state, incidental: Secondary | ICD-10-CM

## 2012-05-28 NOTE — Progress Notes (Signed)
GBS, GC, Chlamydia negative Plan sono in 2 weeks

## 2012-05-28 NOTE — Progress Notes (Signed)
Pt stated she thinks she lost her mucous  plug last week. Pt no issues today / pt wants cervix check today .

## 2012-06-04 ENCOUNTER — Ambulatory Visit (INDEPENDENT_AMBULATORY_CARE_PROVIDER_SITE_OTHER): Payer: Self-pay | Admitting: Obstetrics and Gynecology

## 2012-06-04 ENCOUNTER — Encounter: Payer: Self-pay | Admitting: Obstetrics and Gynecology

## 2012-06-04 VITALS — BP 112/60 | Wt 190.0 lb

## 2012-06-04 DIAGNOSIS — Z331 Pregnant state, incidental: Secondary | ICD-10-CM

## 2012-06-04 NOTE — Progress Notes (Signed)
Increased pressure No LOF  No bleeding C/O chest heaviness

## 2012-06-04 NOTE — Progress Notes (Signed)
C/O of a tiredness in chest Desires cervix check

## 2012-06-11 ENCOUNTER — Ambulatory Visit (INDEPENDENT_AMBULATORY_CARE_PROVIDER_SITE_OTHER): Payer: Self-pay

## 2012-06-11 DIAGNOSIS — O3660X0 Maternal care for excessive fetal growth, unspecified trimester, not applicable or unspecified: Secondary | ICD-10-CM

## 2012-06-11 DIAGNOSIS — Z331 Pregnant state, incidental: Secondary | ICD-10-CM

## 2012-06-12 ENCOUNTER — Ambulatory Visit (INDEPENDENT_AMBULATORY_CARE_PROVIDER_SITE_OTHER): Payer: Self-pay | Admitting: Obstetrics and Gynecology

## 2012-06-12 VITALS — BP 104/58 | Wt 195.0 lb

## 2012-06-12 DIAGNOSIS — Z331 Pregnant state, incidental: Secondary | ICD-10-CM

## 2012-06-12 NOTE — Progress Notes (Signed)
Pt stated no issues today. Pt wants a cervix check today.  

## 2012-06-12 NOTE — Progress Notes (Signed)
Ultrasound: EFW  8 lbs 11 oz  94%         AFI  17.5 normal  Vertex Increased mucous. No reg contractions Pt desires IOL, aware of increased risk of C/S, scheduled 06/18/12 at 7:30

## 2012-06-13 ENCOUNTER — Inpatient Hospital Stay (HOSPITAL_COMMUNITY): Payer: Medicaid Other | Admitting: Anesthesiology

## 2012-06-13 ENCOUNTER — Inpatient Hospital Stay (HOSPITAL_COMMUNITY)
Admission: AD | Admit: 2012-06-13 | Discharge: 2012-06-15 | DRG: 767 | Disposition: A | Discharge status: Home / Self Care | Payer: Medicaid Other | Source: Ambulatory Visit | Attending: Obstetrics and Gynecology | Admitting: Obstetrics and Gynecology

## 2012-06-13 ENCOUNTER — Other Ambulatory Visit: Payer: Self-pay | Admitting: Obstetrics and Gynecology

## 2012-06-13 ENCOUNTER — Telehealth (HOSPITAL_COMMUNITY): Payer: Self-pay | Admitting: *Deleted

## 2012-06-13 ENCOUNTER — Encounter (HOSPITAL_COMMUNITY): Payer: Self-pay | Admitting: *Deleted

## 2012-06-13 ENCOUNTER — Encounter (HOSPITAL_COMMUNITY): Payer: Self-pay | Admitting: Anesthesiology

## 2012-06-13 DIAGNOSIS — Z302 Encounter for sterilization: Secondary | ICD-10-CM

## 2012-06-13 DIAGNOSIS — Z9851 Tubal ligation status: Secondary | ICD-10-CM

## 2012-06-13 LAB — US OB FOLLOW UP

## 2012-06-13 LAB — CBC
Hemoglobin: 12.5 g/dL (ref 12.0–15.0)
MCH: 23.6 pg — ABNORMAL LOW (ref 26.0–34.0)
MCHC: 31.6 g/dL (ref 30.0–36.0)
MCV: 74.7 fL — ABNORMAL LOW (ref 78.0–100.0)
RBC: 5.3 MIL/uL — ABNORMAL HIGH (ref 3.87–5.11)

## 2012-06-13 LAB — SURGICAL PCR SCREEN: MRSA, PCR: NEGATIVE

## 2012-06-13 LAB — RPR: RPR Ser Ql: NONREACTIVE

## 2012-06-13 MED ORDER — PHENYLEPHRINE 40 MCG/ML (10ML) SYRINGE FOR IV PUSH (FOR BLOOD PRESSURE SUPPORT): 80.0000 ug | PREFILLED_SYRINGE | INTRAVENOUS | Status: DC | PRN

## 2012-06-13 MED ORDER — ONDANSETRON HCL 4 MG/2ML IJ SOLN: 4.0000 mg | INTRAMUSCULAR | Status: DC | PRN

## 2012-06-13 MED ORDER — HYDROXYZINE HCL 50 MG/ML IM SOLN: 50.0000 mg | Freq: Four times a day (QID) | INTRAMUSCULAR | Status: DC | PRN

## 2012-06-13 MED ORDER — LACTATED RINGERS IV SOLN: 500.0000 mL | INTRAVENOUS | Status: DC | PRN

## 2012-06-13 MED ORDER — DIBUCAINE 1 % RE OINT: 1.0000 "application " | TOPICAL_OINTMENT | RECTAL | Status: DC | PRN

## 2012-06-13 MED ORDER — LACTATED RINGERS IV SOLN: INTRAVENOUS | Status: DC

## 2012-06-13 MED ORDER — ONDANSETRON HCL 4 MG/2ML IJ SOLN: 4.0000 mg | Freq: Four times a day (QID) | INTRAMUSCULAR | Status: DC | PRN

## 2012-06-13 MED ORDER — EPHEDRINE 5 MG/ML INJ: 10.0000 mg | INTRAVENOUS | Status: DC | PRN

## 2012-06-13 MED ORDER — CITRIC ACID-SODIUM CITRATE 334-500 MG/5ML PO SOLN: 30.0000 mL | ORAL | Status: DC | PRN

## 2012-06-13 MED ORDER — ONDANSETRON HCL 4 MG PO TABS: 4.0000 mg | ORAL_TABLET | ORAL | Status: DC | PRN

## 2012-06-13 MED ORDER — DIPHENHYDRAMINE HCL 25 MG PO CAPS: 25.0000 mg | ORAL_CAPSULE | Freq: Four times a day (QID) | ORAL | Status: DC | PRN

## 2012-06-13 MED ORDER — BENZOCAINE-MENTHOL 20-0.5 % EX AERO
1.0000 "application " | INHALATION_SPRAY | CUTANEOUS | Status: DC | PRN
  Filled 2012-06-13: qty 56

## 2012-06-13 MED ORDER — SODIUM BICARBONATE 8.4 % IV SOLN
INTRAVENOUS | Status: DC | PRN
  Administered 2012-06-13: 4 mL via EPIDURAL

## 2012-06-13 MED ORDER — OXYCODONE-ACETAMINOPHEN 5-325 MG PO TABS: 1.0000 | ORAL_TABLET | ORAL | Status: DC | PRN

## 2012-06-13 MED ORDER — FAMOTIDINE 20 MG PO TABS
40.0000 mg | ORAL_TABLET | Freq: Once | ORAL | Status: AC
  Administered 2012-06-14: 40 mg via ORAL
  Filled 2012-06-13: qty 2

## 2012-06-13 MED ORDER — PRENATAL MULTIVITAMIN CH
1.0000 | ORAL_TABLET | Freq: Every day | ORAL | Status: DC
  Filled 2012-06-13: qty 1

## 2012-06-13 MED ORDER — ZOLPIDEM TARTRATE 5 MG PO TABS: 5.0000 mg | ORAL_TABLET | Freq: Every evening | ORAL | Status: DC | PRN

## 2012-06-13 MED ORDER — OXYTOCIN 40 UNITS IN LACTATED RINGERS INFUSION - SIMPLE MED
62.5000 mL/h | Freq: Once | INTRAVENOUS | Status: AC
  Administered 2012-06-13: 62.5 mL/h via INTRAVENOUS
  Filled 2012-06-13: qty 1000

## 2012-06-13 MED ORDER — WITCH HAZEL-GLYCERIN EX PADS: 1.0000 "application " | MEDICATED_PAD | CUTANEOUS | Status: DC | PRN

## 2012-06-13 MED ORDER — OXYTOCIN BOLUS FROM INFUSION
250.0000 mL | Freq: Once | INTRAVENOUS | Status: AC
  Administered 2012-06-13: 250 mL via INTRAVENOUS
  Filled 2012-06-13: qty 500

## 2012-06-13 MED ORDER — PHENYLEPHRINE 40 MCG/ML (10ML) SYRINGE FOR IV PUSH (FOR BLOOD PRESSURE SUPPORT)
80.0000 ug | PREFILLED_SYRINGE | INTRAVENOUS | Status: DC | PRN
  Filled 2012-06-13: qty 5

## 2012-06-13 MED ORDER — LIDOCAINE HCL (PF) 1 % IJ SOLN
30.0000 mL | INTRAMUSCULAR | Status: DC | PRN
  Filled 2012-06-13: qty 30

## 2012-06-13 MED ORDER — MUPIROCIN CALCIUM 2 % EX CREA
TOPICAL_CREAM | Freq: Two times a day (BID) | CUTANEOUS | Status: DC
Start: 2012-06-13 — End: 2012-06-13

## 2012-06-13 MED ORDER — LACTATED RINGERS IV SOLN
INTRAVENOUS | Status: DC
  Administered 2012-06-14: 11:00:00 via INTRAVENOUS

## 2012-06-13 MED ORDER — LANOLIN HYDROUS EX OINT: TOPICAL_OINTMENT | CUTANEOUS | Status: DC | PRN

## 2012-06-13 MED ORDER — ACETAMINOPHEN 325 MG PO TABS: 650.0000 mg | ORAL_TABLET | ORAL | Status: DC | PRN

## 2012-06-13 MED ORDER — MUPIROCIN 2 % EX OINT
TOPICAL_OINTMENT | Freq: Two times a day (BID) | CUTANEOUS | Status: DC
  Administered 2012-06-14 (×2): via NASAL
  Filled 2012-06-13: qty 22

## 2012-06-13 MED ORDER — TETANUS-DIPHTH-ACELL PERTUSSIS 5-2.5-18.5 LF-MCG/0.5 IM SUSP: 0.5000 mL | Freq: Once | INTRAMUSCULAR | Status: DC

## 2012-06-13 MED ORDER — DIPHENHYDRAMINE HCL 50 MG/ML IJ SOLN: 12.5000 mg | INTRAMUSCULAR | Status: DC | PRN

## 2012-06-13 MED ORDER — HYDROXYZINE HCL 50 MG PO TABS: 50.0000 mg | ORAL_TABLET | Freq: Four times a day (QID) | ORAL | Status: DC | PRN

## 2012-06-13 MED ORDER — SIMETHICONE 80 MG PO CHEW: 80.0000 mg | CHEWABLE_TABLET | ORAL | Status: DC | PRN

## 2012-06-13 MED ORDER — METOCLOPRAMIDE HCL 10 MG PO TABS
10.0000 mg | ORAL_TABLET | Freq: Once | ORAL | Status: AC
  Administered 2012-06-14: 10 mg via ORAL
  Filled 2012-06-13: qty 1

## 2012-06-13 MED ORDER — EPHEDRINE 5 MG/ML INJ
10.0000 mg | INTRAVENOUS | Status: DC | PRN
  Filled 2012-06-13: qty 4

## 2012-06-13 MED ORDER — SENNOSIDES-DOCUSATE SODIUM 8.6-50 MG PO TABS
2.0000 | ORAL_TABLET | Freq: Every day | ORAL | Status: DC
  Administered 2012-06-13 – 2012-06-14 (×2): 2 via ORAL

## 2012-06-13 MED ORDER — MEDROXYPROGESTERONE ACETATE 150 MG/ML IM SUSP: 150.0000 mg | INTRAMUSCULAR | Status: DC | PRN

## 2012-06-13 MED ORDER — OXYCODONE-ACETAMINOPHEN 5-325 MG PO TABS
1.0000 | ORAL_TABLET | ORAL | Status: DC | PRN
  Administered 2012-06-14 (×2): 2 via ORAL
  Administered 2012-06-15: 1 via ORAL
  Administered 2012-06-15: 2 via ORAL
  Administered 2012-06-15: 1 via ORAL
  Filled 2012-06-13 (×4): qty 2

## 2012-06-13 MED ORDER — FENTANYL 2.5 MCG/ML BUPIVACAINE 1/10 % EPIDURAL INFUSION (WH - ANES)
14.0000 mL/h | INTRAMUSCULAR | Status: DC
  Filled 2012-06-13: qty 60

## 2012-06-13 MED ORDER — FLEET ENEMA 7-19 GM/118ML RE ENEM: 1.0000 | ENEMA | RECTAL | Status: DC | PRN

## 2012-06-13 MED ORDER — MAGNESIUM HYDROXIDE 400 MG/5ML PO SUSP: 30.0000 mL | ORAL | Status: DC | PRN

## 2012-06-13 MED ORDER — FENTANYL 2.5 MCG/ML BUPIVACAINE 1/10 % EPIDURAL INFUSION (WH - ANES)
INTRAMUSCULAR | Status: DC | PRN
  Administered 2012-06-13: 14 mL/h via EPIDURAL

## 2012-06-13 MED ORDER — LACTATED RINGERS IV SOLN: 500.0000 mL | Freq: Once | INTRAVENOUS | Status: DC

## 2012-06-13 MED ORDER — IBUPROFEN 600 MG PO TABS: 600.0000 mg | ORAL_TABLET | Freq: Four times a day (QID) | ORAL | Status: DC | PRN

## 2012-06-13 MED ORDER — IBUPROFEN 600 MG PO TABS
600.0000 mg | ORAL_TABLET | Freq: Four times a day (QID) | ORAL | Status: DC
  Administered 2012-06-13 – 2012-06-15 (×6): 600 mg via ORAL
  Filled 2012-06-13 (×6): qty 1

## 2012-06-13 NOTE — Anesthesia Preprocedure Evaluation (Signed)

## 2012-06-13 NOTE — Progress Notes (Signed)
Unable to move pt due pt unable to move around her legs.  Pericare given to pt in the bed and plan to take to [facility] rm 119 via stretcher

## 2012-06-13 NOTE — Anesthesia Procedure Notes (Signed)
Epidural   Needle:  Needle insertion depth: 6 cm Catheter at skin depth: 12 cm  Additional Notes Dosing of Epidural:  1st dose, through needle ............................................Marland Kitchen epi 1:200K + Xylocaine 40 mg  2nd dose, through catheter, after waiting 3 minutes...Marland KitchenMarland Kitchenepi 1:200K + Xylocaine 60 mg  3rd dose, through catheter after waiting 3 minutes .............................Marcaine   5mg    ( mg Marcaine are expressed as equivilent  cc's medication removed from the 0.1%Bupiv / fentanyl syringe from L&D pump)  ( 2% Xylo charted as a single dose in Epic Meds for ease of charting; actual dosing was fractionated as above, for saftey's sake)  As each dose occurred, patient was free of IV sx; and patient exhibited no evidence of SA injection.  Patient is more comfortable after epidural dosed. Please see RN's note for documentation of vital signs,and FHR which are stable.  Patient reminded not to try to ambulate with numb legs, and that an RN must be present when she attempts to get up.

## 2012-06-13 NOTE — MAU Note (Signed)
Pt reports having ctx all night. Denies bleeding or leaking. Good fetal moveement

## 2012-06-13 NOTE — Telephone Encounter (Signed)
Preadmission screen  

## 2012-06-14 ENCOUNTER — Inpatient Hospital Stay (HOSPITAL_COMMUNITY): Payer: Medicaid Other | Admitting: Anesthesiology

## 2012-06-14 ENCOUNTER — Encounter (HOSPITAL_COMMUNITY)
Admission: AD | Disposition: A | Discharge status: Home / Self Care | Payer: Medicaid Other | Source: Ambulatory Visit | Attending: Obstetrics and Gynecology

## 2012-06-14 ENCOUNTER — Encounter (HOSPITAL_COMMUNITY): Payer: Self-pay | Admitting: Anesthesiology

## 2012-06-14 ENCOUNTER — Encounter (HOSPITAL_COMMUNITY): Payer: Self-pay | Admitting: *Deleted

## 2012-06-14 DIAGNOSIS — Z302 Encounter for sterilization: Secondary | ICD-10-CM

## 2012-06-14 DIAGNOSIS — Z641 Problems related to multiparity: Secondary | ICD-10-CM

## 2012-06-14 DIAGNOSIS — Z9851 Tubal ligation status: Secondary | ICD-10-CM

## 2012-06-14 HISTORY — PX: TUBAL LIGATION: SHX77

## 2012-06-14 LAB — CBC
Platelets: 172 10*3/uL (ref 150–400)
RBC: 4.66 MIL/uL (ref 3.87–5.11)
RDW: 14.3 % (ref 11.5–15.5)
WBC: 15.5 10*3/uL — ABNORMAL HIGH (ref 4.0–10.5)

## 2012-06-14 SURGERY — LIGATION, FALLOPIAN TUBE, POSTPARTUM
Anesthesia: Epidural | Site: Abdomen | Laterality: Bilateral | Wound class: Clean Contaminated

## 2012-06-14 MED ORDER — ONDANSETRON HCL 4 MG/2ML IJ SOLN
INTRAMUSCULAR | Status: DC | PRN
  Administered 2012-06-14: 4 mg via INTRAVENOUS

## 2012-06-14 MED ORDER — SODIUM BICARBONATE 8.4 % IV SOLN
INTRAVENOUS | Status: DC | PRN
  Administered 2012-06-14: 11:00:00 via EPIDURAL

## 2012-06-14 MED ORDER — SODIUM BICARBONATE 8.4 % IV SOLN
INTRAVENOUS | Status: AC
  Filled 2012-06-14: qty 50

## 2012-06-14 MED ORDER — METOCLOPRAMIDE HCL 5 MG/ML IJ SOLN: 10.0000 mg | Freq: Once | INTRAMUSCULAR | Status: AC | PRN

## 2012-06-14 MED ORDER — BUPIVACAINE-EPINEPHRINE 0.5% -1:200000 IJ SOLN
INTRAMUSCULAR | Status: DC | PRN
  Administered 2012-06-14: 1 mL

## 2012-06-14 MED ORDER — FENTANYL CITRATE 0.05 MG/ML IJ SOLN
INTRAMUSCULAR | Status: AC
  Filled 2012-06-14: qty 2

## 2012-06-14 MED ORDER — MEPERIDINE HCL 25 MG/ML IJ SOLN: 6.2500 mg | INTRAMUSCULAR | Status: DC | PRN

## 2012-06-14 MED ORDER — FENTANYL CITRATE 0.05 MG/ML IJ SOLN: 25.0000 ug | INTRAMUSCULAR | Status: DC | PRN

## 2012-06-14 MED ORDER — ACETAMINOPHEN 500 MG PO TABS
1000.0000 mg | ORAL_TABLET | ORAL | Status: DC | PRN
  Filled 2012-06-14: qty 2

## 2012-06-14 MED ORDER — ONDANSETRON HCL 4 MG/2ML IJ SOLN
INTRAMUSCULAR | Status: AC
  Filled 2012-06-14: qty 2

## 2012-06-14 MED ORDER — LIDOCAINE-EPINEPHRINE (PF) 2 %-1:200000 IJ SOLN
INTRAMUSCULAR | Status: AC
  Filled 2012-06-14: qty 20

## 2012-06-14 MED ORDER — BUPIVACAINE-EPINEPHRINE (PF) 0.5% -1:200000 IJ SOLN
INTRAMUSCULAR | Status: AC
  Filled 2012-06-14: qty 10

## 2012-06-14 MED ORDER — MIDAZOLAM HCL 2 MG/2ML IJ SOLN
INTRAMUSCULAR | Status: AC
  Filled 2012-06-14: qty 2

## 2012-06-14 SURGICAL SUPPLY — 25 items
CHLORAPREP W/TINT 26ML (MISCELLANEOUS) ×2 IMPLANT
CLOTH BEACON ORANGE TIMEOUT ST (SAFETY) ×2 IMPLANT
CONTAINER PREFILL 10% NBF 15ML (MISCELLANEOUS) ×4 IMPLANT
DERMABOND ADVANCED (GAUZE/BANDAGES/DRESSINGS) ×1
DERMABOND ADVANCED .7 DNX12 (GAUZE/BANDAGES/DRESSINGS) ×1 IMPLANT
ELECT REM PT RETURN 9FT ADLT (ELECTROSURGICAL) ×2
ELECTRODE REM PT RTRN 9FT ADLT (ELECTROSURGICAL) ×1 IMPLANT
GLOVE BIO SURGEON STRL SZ 6.5 (GLOVE) ×4 IMPLANT
GLOVE BIOGEL PI IND STRL 7.0 (GLOVE) ×1 IMPLANT
GLOVE BIOGEL PI INDICATOR 7.0 (GLOVE) ×1
GOWN PREVENTION PLUS LG XLONG (DISPOSABLE) ×4 IMPLANT
NEEDLE HYPO 25X1 1.5 SAFETY (NEEDLE) ×2 IMPLANT
NS IRRIG 1000ML POUR BTL (IV SOLUTION) ×2 IMPLANT
PACK ABDOMINAL MINOR (CUSTOM PROCEDURE TRAY) ×2 IMPLANT
PENCIL BUTTON HOLSTER BLD 10FT (ELECTRODE) ×2 IMPLANT
SPONGE LAP 4X18 X RAY DECT (DISPOSABLE) IMPLANT
SUT MNCRL AB 3-0 PS2 27 (SUTURE) ×2 IMPLANT
SUT PLAIN 2 0 (SUTURE) ×3
SUT PLAIN ABS 2-0 54XMFL TIE (SUTURE) ×1 IMPLANT
SUT PLAIN ABS 2-0 CT1 27XMFL (SUTURE) ×2 IMPLANT
SUT VIC AB 0 CT1 27 (SUTURE) ×1
SUT VIC AB 0 CT1 27XBRD ANBCTR (SUTURE) ×1 IMPLANT
SYR CONTROL 10ML LL (SYRINGE) ×2 IMPLANT
TOWEL OR 17X24 6PK STRL BLUE (TOWEL DISPOSABLE) ×4 IMPLANT
TRAY FOLEY CATH 14FR (SET/KITS/TRAYS/PACK) ×2 IMPLANT

## 2012-06-14 NOTE — Op Note (Signed)
reop diagnosis: Multiparity desires sterilization  Postop diagnosis: Same  Procedure: Postpartum tubal ligation Surgeon: Vibhav Waddill Anesthesia:epidural Estimated blood loss: Minimal Complications: None  Pathology: Bilateral portion of tubes Condition and transferred to PACU: stable   Before the tubal ligation was done, the patient was told that the risks are but not limited to bleeding infection damage to internal organs such as bowel bladder major blood vessels. The patient understands that the failure rate is 1 in 200. She understands a half of those failures can result in ectopic or tubal pregnancy. The patient was also well aware of all birth control that was available to her. The patient was taken to the operating room. Once her epidural was found to be adequate, a timeout was done. The patient's consent was signed.  20 cc of local was she used to infiltrate around the umbilicus to obtain adequate anesthesia. This amounts was used because the patient felt prickly touch with the Allis clamp. Once all the anesthesia was found to be adequate, I began the procedure. A 10 mm infraumbilical incision was made along her previous incision. The fascia was then grasped with 2 cokers after the subcutaneous tissue had been bluntly and sharply dissected. The fascia was then incised and extended bilaterally. The peritoneum was entered bluntly. The patient's right fallopian tube was grasped with Babcock clamp. Follow out to the fimbriated end. And a half a centimeter of the mid isthmic portion of the tube was ligated with 2-0 plain and excised. The patient's left fallopian tube was grasped with Babcock clamp followed out to the fimbriated end. About a centimeter of the mid isthmic portion of the tube was ligated with 2-0 plain and excised. Both tubes were returned to the abdomen. Hemostasis was noted. The peritoneum was closed with 0 Vicryl. The fascia was also closed using 0 Vicryl. The skin was closed with 3-0  Monocryl in subcuticular fashion. Sponge lap and needle counts were correct. Patient went to recovery in stable condition. 

## 2012-06-14 NOTE — Anesthesia Postprocedure Evaluation (Signed)
  Anesthesia Post-op Note  Patient: Britzy H Wellbrock  Procedure(s) Performed: Procedure(s) (LRB): POST PARTUM TUBAL LIGATION (Bilateral)  Patient Location: PACU and Mother/Baby  Anesthesia Type: General  Level of Consciousness: awake, alert , oriented and patient cooperative  Airway and Oxygen Therapy: Patient Spontanous Breathing  Post-op Pain: moderate  Post-op Assessment: Post-op Vital signs reviewed  Post-op Vital Signs: Reviewed and stable  Complications: No apparent anesthesia complications Anesthesia Post Note  Patient: Cynthie H Souter  Procedure(s) Performed: Procedure(s) (LRB): POST PARTUM TUBAL LIGATION (Bilateral)  Anesthesia type: General  Patient location: Women's Unit  Post pain: Pain level controlled  Post assessment: Post-op Vital signs reviewed  Last Vitals:  Filed Vitals:   06/14/12 1315  BP: 114/80  Pulse: 74  Temp: 36.5 C  Resp: 16    Post vital signs: Reviewed  Level of consciousness: awake  Complications: No apparent anesthesia complications

## 2012-06-14 NOTE — Progress Notes (Signed)
UR chart review completed.  

## 2012-06-14 NOTE — Anesthesia Preprocedure Evaluation (Signed)
Anesthesia Evaluation  Patient identified by MRN, date of birth, ID band Patient awake    Reviewed: Allergy & Precautions, H&P , Patient's Chart, lab work & pertinent test results  Airway Mallampati: II TM Distance: >3 FB Neck ROM: full    Dental  (+) Teeth Intact   Pulmonary neg pulmonary ROS,  breath sounds clear to auscultation        Cardiovascular negative cardio ROS  Rhythm:regular Rate:Normal     Neuro/Psych  Headaches, negative psych ROS   GI/Hepatic negative GI ROS, Neg liver ROS,   Endo/Other  negative endocrine ROS  Renal/GU negative Renal ROS     Musculoskeletal   Abdominal   Peds  Hematology negative hematology ROS (+)   Anesthesia Other Findings       Reproductive/Obstetrics negative OB ROS                           Anesthesia Physical  Anesthesia Plan  ASA: II  Anesthesia Plan: Epidural   Post-op Pain Management:    Induction:   Airway Management Planned:   Additional Equipment:   Intra-op Plan:   Post-operative Plan:   Informed Consent: I have reviewed the patients History and Physical, chart, labs and discussed the procedure including the risks, benefits and alternatives for the proposed anesthesia with the patient or authorized representative who has indicated his/her understanding and acceptance.   Dental Advisory Given  Plan Discussed with: Anesthesiologist, CRNA and Surgeon  Anesthesia Plan Comments: (Labs checked- platelets confirmed with RN in room. Fetal heart tracing, per RN, reported to be stable enough for sitting procedure. Discussed epidural, and patient consents to the procedure:  included risk of possible headache,backache, failed block, allergic reaction, and nerve injury. This patient was asked if she had any questions or concerns before the procedure started. )        Anesthesia Quick Evaluation

## 2012-06-14 NOTE — Anesthesia Postprocedure Evaluation (Signed)
  Anesthesia Post-op Note  Patient: Michelle Curry  Procedure(s) Performed: Procedure(s) (LRB): POST PARTUM TUBAL LIGATION (Bilateral)  Patient Location: PACU  Anesthesia Type: Epidural  Level of Consciousness: awake, alert  and oriented  Airway and Oxygen Therapy: Patient Spontanous Breathing  Post-op Pain: none  Post-op Assessment: Post-op Vital signs reviewed, Patient's Cardiovascular Status Stable, Respiratory Function Stable, Patent Airway, No signs of Nausea or vomiting and Pain level controlled  Post-op Vital Signs: Reviewed and stable  Complications: No apparent anesthesia complications

## 2012-06-14 NOTE — Anesthesia Postprocedure Evaluation (Signed)
Anesthesia Post Note  Patient: Michelle Curry  Procedure(s) Performed: * No procedures listed *  Anesthesia type: Epidural  Patient location: Mother/Baby  Post pain: Pain level controlled  Post assessment: Post-op Vital signs reviewed  Last Vitals:  Filed Vitals:   06/14/12 0526  BP: 115/75  Pulse: 80  Temp: 36.9 C  Resp: 18    Post vital signs: Reviewed  Level of consciousness: awake  Complications: No apparent anesthesia complications

## 2012-06-14 NOTE — Transfer of Care (Signed)
Immediate Anesthesia Transfer of Care Note  Patient: Michelle Curry  Procedure(s) Performed: Procedure(s) (LRB): POST PARTUM TUBAL LIGATION (Bilateral)  Patient Location: PACU  Anesthesia Type: Epidural  Level of Consciousness: awake, alert  and oriented  Airway & Oxygen Therapy: Patient Spontanous Breathing  Post-op Assessment: Report given to PACU RN and Post -op Vital signs reviewed and stable  Post vital signs: Reviewed and stable  Complications: No apparent anesthesia complications

## 2012-06-14 NOTE — Progress Notes (Signed)
Post Partum Day 1 Subjective: no complaints, up ad lib without syncope, voiding, tolerating PO, + flatus  Pain well controlled with po meds Bottlefeeding, baby in crib at [facility], family in room, pt NPO at present awaiting to go for PP BTL  Mood stable, bonding well   Objective: Blood pressure 108/73, pulse 80, temperature 98 F (36.7 C), temperature source Oral, resp. rate 18, height 5\' 3"  (1.6 m), weight 195 lb (88.451 kg), last menstrual period 09/18/2011, SpO2 100.00%, unknown if currently breastfeeding.  Physical Exam:  General: alert and no distress Lungs: CTAB Heart: RRR Breasts: WNL Lochia: appropriate Uterine Fundus: firm Perineum: WNL DVT Evaluation: No evidence of DVT seen on physical exam. Negative Homan's sign. No significant calf/ankle edema.   Basename 06/14/12 0530 06/13/12 0855  HGB 11.1* 12.5  HCT 34.9* 39.6    Assessment/Plan: Plan for discharge tomorrow and Contraception PP BTL       LOS: 1 day   Dailyn Reith M 06/14/2012, 9:05 AM

## 2012-06-14 NOTE — Addendum Note (Signed)
Addendum  created 06/14/12 1616 by Rosalia Hammers, CRNA   Modules edited:Notes Section

## 2012-06-15 ENCOUNTER — Encounter (HOSPITAL_COMMUNITY): Payer: Self-pay | Admitting: Obstetrics and Gynecology

## 2012-06-15 MED ORDER — OXYCODONE-ACETAMINOPHEN 5-325 MG PO TABS: 1.0000 | ORAL_TABLET | ORAL | Status: DC | PRN

## 2012-06-15 MED ORDER — IBUPROFEN 600 MG PO TABS: 600.0000 mg | ORAL_TABLET | Freq: Four times a day (QID) | ORAL | Status: AC | PRN

## 2012-06-15 NOTE — Discharge Summary (Signed)
Physician Discharge Summary  Patient ID: DEAH OTTAWAY MRN: [id number] DOB/AGE: 1981/04/02 30 y.o.  Admit date: 06/13/2012 Discharge date: 06/15/2012  Admission Diagnoses: term pg active labor  Discharge Diagnoses:  Principal Problem:  *NSVD (normal spontaneous vaginal delivery) Active Problems:  Nuchal cord, delivered, current hospitalization  S/P tubal ligation nonlactating  Discharged Condition: stable  Hospital Course:term pg active labor, SVD, BTL, normal involution   Consults: None  Significant Diagnostic Studies: labs:  Hemoglobin & Hematocrit     Component Value Date/Time   HGB 11.1* 06/14/2012 0530   HGB 11.5* 08/31/2011 1518   HCT 34.9* 06/14/2012 0530   HCT 36.1 08/31/2011 1518      Treatments: IV hydration  Discharge Exam: Blood pressure 111/79, pulse 80, temperature 98.7 F (37.1 C), temperature source Oral, resp. rate 18, height 5\' 3"  (1.6 m), weight 195 lb (88.451 kg), last menstrual period 09/18/2011, SpO2 98.00%, unknown if currently breastfeeding. General appearance: alert, cooperative and no distress  S: comfortable, little bleeding, slept some    bottle feeding O VSS lungs clear bilaterally, bowel sounds hypoactive, abd softly distended     abd soft, nt, ff      sm  flowperineum clean intact     -Homans sign bilaterally,          edema A normal involution SVD BTL    Non Lactating     PP day 2  Disposition: home with new born   Medication List  As of 06/15/2012  8:15 AM   STOP taking these medications         ferrous sulfate 325 (65 FE) MG tablet         TAKE these medications         ibuprofen 600 MG tablet   Commonly known as: ADVIL,MOTRIN   Take 1 tablet (600 mg total) by mouth every 6 (six) hours as needed for pain.      oxyCODONE-acetaminophen 5-325 MG per tablet   Commonly known as: PERCOCET   Take 1-2 tablets by mouth every 4 (four) hours as needed (moderate - severe pain).           Follow-up Information    Follow up  with [facility] in 6 weeks.       [facility] handbook  SignedLavera Guise 06/15/2012, 8:15 AM

## 2012-06-17 ENCOUNTER — Other Ambulatory Visit: Payer: Self-pay | Admitting: Obstetrics and Gynecology

## 2012-06-17 ENCOUNTER — Encounter (HOSPITAL_COMMUNITY): Payer: Self-pay | Admitting: *Deleted

## 2012-06-17 ENCOUNTER — Inpatient Hospital Stay (HOSPITAL_COMMUNITY)
Admission: AD | Admit: 2012-06-17 | Discharge: 2012-06-17 | Disposition: A | Discharge status: Home / Self Care | Payer: Medicaid Other | Source: Ambulatory Visit | Attending: Obstetrics and Gynecology | Admitting: Obstetrics and Gynecology

## 2012-06-17 DIAGNOSIS — IMO0002 Reserved for concepts with insufficient information to code with codable children: Secondary | ICD-10-CM | POA: Insufficient documentation

## 2012-06-17 DIAGNOSIS — M545 Low back pain: Secondary | ICD-10-CM

## 2012-06-17 DIAGNOSIS — O09299 Supervision of pregnancy with other poor reproductive or obstetric history, unspecified trimester: Secondary | ICD-10-CM

## 2012-06-17 DIAGNOSIS — R51 Headache: Secondary | ICD-10-CM | POA: Insufficient documentation

## 2012-06-17 DIAGNOSIS — R609 Edema, unspecified: Secondary | ICD-10-CM

## 2012-06-17 LAB — LACTATE DEHYDROGENASE: LDH: 171 U/L (ref 94–250)

## 2012-06-17 LAB — CBC WITH DIFFERENTIAL/PLATELET
Basophils Relative: 0 % (ref 0–1)
Eosinophils Absolute: 0.1 10*3/uL (ref 0.0–0.7)
HCT: 33 % — ABNORMAL LOW (ref 36.0–46.0)
Hemoglobin: 10.5 g/dL — ABNORMAL LOW (ref 12.0–15.0)
Lymphs Abs: 1.7 10*3/uL (ref 0.7–4.0)
MCH: 23.9 pg — ABNORMAL LOW (ref 26.0–34.0)
MCHC: 31.8 g/dL (ref 30.0–36.0)
MCV: 75.2 fL — ABNORMAL LOW (ref 78.0–100.0)
Monocytes Absolute: 0.7 10*3/uL (ref 0.1–1.0)
Monocytes Relative: 7 % (ref 3–12)
Neutrophils Relative %: 75 % (ref 43–77)
RBC: 4.39 MIL/uL (ref 3.87–5.11)

## 2012-06-17 LAB — COMPREHENSIVE METABOLIC PANEL
Alkaline Phosphatase: 111 U/L (ref 39–117)
BUN: 8 mg/dL (ref 6–23)
Creatinine, Ser: 0.69 mg/dL (ref 0.50–1.10)
GFR calc Af Amer: >90 mL/min (ref >90)
Glucose, Bld: 69 mg/dL — ABNORMAL LOW (ref 70–99)
Potassium: 3.9 mEq/L (ref 3.5–5.1)
Total Bilirubin: 0.4 mg/dL (ref 0.3–1.2)
Total Protein: 5.2 g/dL — ABNORMAL LOW (ref 6.0–8.3)

## 2012-06-17 LAB — URINALYSIS, ROUTINE W REFLEX MICROSCOPIC
Bilirubin Urine: NEGATIVE
Nitrite: NEGATIVE
Protein, ur: NEGATIVE mg/dL
Urobilinogen, UA: 0.2 mg/dL (ref 0.0–1.0)

## 2012-06-17 LAB — URIC ACID: Uric Acid, Serum: 5.7 mg/dL (ref 2.4–7.0)

## 2012-06-17 MED ORDER — HYDROCHLOROTHIAZIDE 25 MG PO TABS: 12.5000 mg | ORAL_TABLET | Freq: Every day | ORAL | Status: DC

## 2012-06-17 MED ORDER — CYCLOBENZAPRINE HCL 10 MG PO TABS: 10.0000 mg | ORAL_TABLET | Freq: Three times a day (TID) | ORAL | Status: DC | PRN

## 2012-06-17 NOTE — MAU Note (Signed)
"  I had my baby on Wednesday, a tubal ligation on Thursday and was d/c'd home on Friday.  I haven't felt right since going home on Friday.  My vision is off, blurry.  I feel lightheaded and swollen.  They put me on Oxycodone after my tubal.  I thought maybe I was feeling like that from the medication, so I stopped taking it Friday.  I have a slight H/A beginning.  I haven't had one since I have been at home, because I've ben taking Ibuprofen and Tylenol around the clock.  So, I haven't had time to get a H/A.  Last night I had a lot of pressure in my head (like it was swelling).  I feel like I am gaining weight.  I was 195lbs at my last doctor's appointment.  When I got home from the hospital I weighed myself and I was 188lbs.  Now I'm back up to 195lbs."

## 2012-06-17 NOTE — Progress Notes (Addendum)
History   "I had my baby on Wednesday, a tubal ligation on Thursday and was d/c'd home on Friday. I haven't felt right since going home on Friday. My vision is off, blurry. I feel lightheaded and swollen. They put me on Oxycodone after my tubal. I thought maybe I was feeling like that from the medication, so I stopped taking it Friday. I have a slight H/A beginning. I haven't had one since I have been at home, because I've ben taking Ibuprofen and Tylenol around the clock. So, I haven't had time to get a H/A. Last night I had a lot of pressure in my head (like it was swelling). I feel like I am gaining weight. I was 195lbs at my last doctor's appointment. When I got home from the hospital I weighed myself and I was 188lbs. Now I'm back up to 195lbs." Now with headache frontal and R side to back ache, only 3 voids yesterday after 6 16 oz water bottles. HX SVD with BTL 7/10 Hx prior ppday 8  pre eclampsia  Chief Complaint  Patient presents with  . Edema  edema, weight gain, not feeling right, RLQ side to back pain @SFHPI @  OB History    Grav Para Term Preterm Abortions TAB SAB Ect Mult Living   5 4 4  1  1   2       Past Medical History  Diagnosis Date  . GBS (group B streptococcus) infection 2007  . H/O varicella   . Hx of migraines   . Cystitis     x 2  . Anemia     as a teen     Past Surgical History  Procedure Date  . Tubal ligation 06/14/2012    Procedure: POST PARTUM TUBAL LIGATION;  Surgeon: Michael Litter, MD;  Location: [facility];  Service: Gynecology;  Laterality: Bilateral;    No family history on file.  History  Substance Use Topics  . Smoking status: Not on file  . Smokeless tobacco: Never Used  . Alcohol Use: No    Allergies:  Allergies  Allergen Reactions  . Bactrim (Sulfamethoxazole W-Trimethoprim) Other (See Comments)    Flu like symptoms  . Latex     Headache    Prescriptions prior to admission  Medication Sig Dispense Refill  . acetaminophen  (TYLENOL) 325 MG tablet Take 325 mg by mouth every 6 (six) hours as needed. Takes for pain      . ibuprofen (ADVIL,MOTRIN) 600 MG tablet Take 1 tablet (600 mg total) by mouth every 6 (six) hours as needed for pain.  30 tablet  2  . IRON PO Take 2 tablets by mouth daily.       . Pyridoxine HCl (B-6 PO) Take 1 tablet by mouth at bedtime.       Patient Vitals for the past 24 hrs:  BP Temp Temp src Pulse Resp Height Weight  06/17/12 1258 120/74 mmHg - - 69  - - -  06/17/12 1257 114/78 mmHg - - 71  - - -  06/17/12 1255 118/78 mmHg - - 82  - - -  06/17/12 1245 133/78 mmHg - - 71  - - -  06/17/12 1200 126/78 mmHg - - 69  - - -  06/17/12 1145 120/82 mmHg - - 83  - - -  06/17/12 1130 118/78 mmHg - - 82  - - -  06/17/12 1116 128/87 mmHg - - 71  - - -  06/17/12 1058 131/83 mmHg  98.5 F (36.9 C) Oral 76  18  5\' 3"  (1.6 m) 194 lb 12.8 oz (88.361 kg)   Results for orders placed during the hospital encounter of 06/17/12 (from the past 24 hour(s))  URINALYSIS, ROUTINE W REFLEX MICROSCOPIC     Status: Abnormal   Collection Time   06/17/12 10:55 AM      Component Value Range   Color, Urine YELLOW  YELLOW   APPearance CLEAR  CLEAR   Specific Gravity, Urine 1.015  1.005 - 1.030   pH 6.0  5.0 - 8.0   Glucose, UA NEGATIVE  NEGATIVE mg/dL   Hgb urine dipstick TRACE (*) NEGATIVE   Bilirubin Urine NEGATIVE  NEGATIVE   Ketones, ur NEGATIVE  NEGATIVE mg/dL   Protein, ur NEGATIVE  NEGATIVE mg/dL   Urobilinogen, UA 0.2  0.0 - 1.0 mg/dL   Nitrite NEGATIVE  NEGATIVE   Leukocytes, UA NEGATIVE  NEGATIVE  URINE MICROSCOPIC-ADD ON     Status: Normal   Collection Time   06/17/12 10:55 AM      Component Value Range   Squamous Epithelial / LPF RARE  RARE   WBC, UA 0-2  <3 WBC/hpf   RBC / HPF 0-2  <3 RBC/hpf   Bacteria, UA RARE  RARE  CBC WITH DIFFERENTIAL     Status: Abnormal   Collection Time   06/17/12 11:01 AM      Component Value Range   WBC 9.8  4.0 - 10.5 K/uL   RBC 4.39  3.87 - 5.11 MIL/uL    Hemoglobin 10.5 (*) 12.0 - 15.0 g/dL   HCT 16.1 (*) 09.6 - 04.5 %   MCV 75.2 (*) 78.0 - 100.0 fL   MCH 23.9 (*) 26.0 - 34.0 pg   MCHC 31.8  30.0 - 36.0 g/dL   RDW 40.9  81.1 - 91.4 %   Platelets 170  150 - 400 K/uL   Neutrophils Relative 75  43 - 77 %   Neutro Abs 7.4  1.7 - 7.7 K/uL   Lymphocytes Relative 17  12 - 46 %   Lymphs Abs 1.7  0.7 - 4.0 K/uL   Monocytes Relative 7  3 - 12 %   Monocytes Absolute 0.7  0.1 - 1.0 K/uL   Eosinophils Relative 1  0 - 5 %   Eosinophils Absolute 0.1  0.0 - 0.7 K/uL   Basophils Relative 0  0 - 1 %   Basophils Absolute 0.0  0.0 - 0.1 K/uL  COMPREHENSIVE METABOLIC PANEL     Status: Abnormal   Collection Time   06/17/12 11:01 AM      Component Value Range   Sodium 139  135 - 145 mEq/L   Potassium 3.9  3.5 - 5.1 mEq/L   Chloride 107  96 - 112 mEq/L   CO2 25  19 - 32 mEq/L   Glucose, Bld 69 (*) 70 - 99 mg/dL   BUN 8  6 - 23 mg/dL   Creatinine, Ser 7.82  0.50 - 1.10 mg/dL   Calcium 8.6  8.4 - 95.6 mg/dL   Total Protein 5.2 (*) 6.0 - 8.3 g/dL   Albumin 2.8 (*) 3.5 - 5.2 g/dL   AST 43 (*) 0 - 37 U/L   ALT 35  0 - 35 U/L   Alkaline Phosphatase 111  39 - 117 U/L   Total Bilirubin 0.4  0.3 - 1.2 mg/dL   GFR calc non Af Amer >90  >90 mL/min   GFR calc  Af Amer >90  >90 mL/min  LACTATE DEHYDROGENASE     Status: Normal   Collection Time   06/17/12 11:01 AM      Component Value Range   LDH 171  94 - 250 U/L  URIC ACID     Status: Normal   Collection Time   06/17/12 11:01 AM      Component Value Range   Uric Acid, Serum 5.7  2.4 - 7.0 mg/dL  @PHYSEXAMBYAGE2 @ Calm, no distress,  lungs clear bilaterally, AP RRR, abd soft nt,no masses, not tympanic bowel sounds active, abdomen nontender except incision at U that is well approximated no redness, edema, or drainage, neg CVAT +1 pitting edema to lower extremities, DTRS +2 bilaterally no clonus BP antepratum 100/60s A SVD PP day 5 BTL P bp elevation and elevated AST    backache Home with pih s/s to  reportt 24 hour urine protein, creatinine, flexeril Collaboration with Dr. Estanislado Pandy. Lavera Guise, CNM  Reviewed all findings with patient. Questions answered. Will discharge home with 24 hour urine and HCTZ 12.5 mg daily for 7 days

## 2012-06-17 NOTE — MAU Note (Signed)
Pt delivered 06/13/12 vaginal delivery. Pt reports  She is gaining weight and having increased swelling in her legs and arms hands and feet. Reports vision is a little blurry as well.

## 2012-06-18 ENCOUNTER — Inpatient Hospital Stay (HOSPITAL_COMMUNITY): Admission: RE | Admit: 2012-06-18 | Payer: Self-pay | Source: Ambulatory Visit

## 2012-06-18 ENCOUNTER — Other Ambulatory Visit: Payer: Self-pay | Admitting: Obstetrics and Gynecology

## 2012-06-18 ENCOUNTER — Telehealth: Payer: Self-pay | Admitting: Obstetrics and Gynecology

## 2012-06-18 NOTE — Telephone Encounter (Signed)
Will leave 24 hr container at the front desk to be picked up by mother or husband.  ld

## 2012-06-18 NOTE — Telephone Encounter (Signed)
Laura/sr pt °

## 2012-06-19 ENCOUNTER — Inpatient Hospital Stay (HOSPITAL_COMMUNITY)
Admission: AD | Admit: 2012-06-19 | Discharge: 2012-06-21 | DRG: 776 | Disposition: A | Discharge status: Home / Self Care | Payer: Medicaid Other | Source: Ambulatory Visit | Attending: Obstetrics and Gynecology | Admitting: Obstetrics and Gynecology

## 2012-06-19 ENCOUNTER — Inpatient Hospital Stay (HOSPITAL_COMMUNITY): Payer: Medicaid Other

## 2012-06-19 ENCOUNTER — Other Ambulatory Visit: Payer: Self-pay

## 2012-06-19 ENCOUNTER — Telehealth: Payer: Self-pay | Admitting: Obstetrics and Gynecology

## 2012-06-19 ENCOUNTER — Encounter (HOSPITAL_COMMUNITY): Payer: Self-pay

## 2012-06-19 ENCOUNTER — Telehealth: Payer: Self-pay

## 2012-06-19 DIAGNOSIS — IMO0002 Reserved for concepts with insufficient information to code with codable children: Principal | ICD-10-CM | POA: Diagnosis present

## 2012-06-19 DIAGNOSIS — I498 Other specified cardiac arrhythmias: Secondary | ICD-10-CM | POA: Diagnosis present

## 2012-06-19 DIAGNOSIS — R7989 Other specified abnormal findings of blood chemistry: Secondary | ICD-10-CM | POA: Diagnosis present

## 2012-06-19 DIAGNOSIS — R748 Abnormal levels of other serum enzymes: Secondary | ICD-10-CM

## 2012-06-19 DIAGNOSIS — R001 Bradycardia, unspecified: Secondary | ICD-10-CM

## 2012-06-19 DIAGNOSIS — O1490 Unspecified pre-eclampsia, unspecified trimester: Secondary | ICD-10-CM | POA: Diagnosis present

## 2012-06-19 DIAGNOSIS — O99893 Other specified diseases and conditions complicating puerperium: Secondary | ICD-10-CM | POA: Diagnosis present

## 2012-06-19 DIAGNOSIS — I495 Sick sinus syndrome: Secondary | ICD-10-CM

## 2012-06-19 HISTORY — DX: Unspecified pre-eclampsia, unspecified trimester: O14.90

## 2012-06-19 HISTORY — DX: Bradycardia, unspecified: R00.1

## 2012-06-19 LAB — COMPREHENSIVE METABOLIC PANEL
ALT: 61 U/L — ABNORMAL HIGH (ref 0–35)
AST: 39 U/L — ABNORMAL HIGH (ref 0–37)
Albumin: 3.1 g/dL — ABNORMAL LOW (ref 3.5–5.2)
Alkaline Phosphatase: 116 U/L (ref 39–117)
Chloride: 106 mEq/L (ref 96–112)
Creatinine, Ser: 0.7 mg/dL (ref 0.50–1.10)
Potassium: 4.1 mEq/L (ref 3.5–5.1)
Sodium: 139 mEq/L (ref 135–145)
Total Bilirubin: 0.3 mg/dL (ref 0.3–1.2)

## 2012-06-19 LAB — CREATININE, URINE, 24 HOUR
Creatinine, 24H Ur: 1050 mg/d (ref 700–1800)
Creatinine, Urine: 32.8 mg/dL

## 2012-06-19 LAB — URINALYSIS, ROUTINE W REFLEX MICROSCOPIC
Bilirubin Urine: NEGATIVE
Nitrite: NEGATIVE
Specific Gravity, Urine: <1.005 — ABNORMAL LOW (ref 1.005–1.030)
pH: 7 (ref 5.0–8.0)

## 2012-06-19 LAB — PROTEIN, URINE, 24 HOUR: Protein, 24H Urine: 352 mg/d — ABNORMAL HIGH (ref 50–100)

## 2012-06-19 LAB — CBC
Platelets: 212 10*3/uL (ref 150–400)
RDW: 14.3 % (ref 11.5–15.5)
WBC: 9.1 10*3/uL (ref 4.0–10.5)

## 2012-06-19 LAB — URIC ACID: Uric Acid, Serum: 6.5 mg/dL (ref 2.4–7.0)

## 2012-06-19 MED ORDER — MAGNESIUM SULFATE BOLUS VIA INFUSION: 0.5000 mg/h | Freq: Once | INTRAVENOUS | Status: DC

## 2012-06-19 MED ORDER — IBUPROFEN 600 MG PO TABS
600.0000 mg | ORAL_TABLET | Freq: Four times a day (QID) | ORAL | Status: DC | PRN
  Administered 2012-06-21: 600 mg via ORAL
  Filled 2012-06-19: qty 1

## 2012-06-19 MED ORDER — MAGNESIUM SULFATE BOLUS VIA INFUSION
0.5000 g/h | Freq: Once | INTRAVENOUS | Status: AC
  Administered 2012-06-19: 0.5 g/h via INTRAVENOUS
  Filled 2012-06-19: qty 500

## 2012-06-19 MED ORDER — MAGNESIUM SULFATE 40 G IN LACTATED RINGERS - SIMPLE
2.0000 g/h | INTRAVENOUS | Status: DC
  Administered 2012-06-20: 1.5 g/h via INTRAVENOUS
  Administered 2012-06-20: 1 g/h via INTRAVENOUS
  Administered 2012-06-20: 2 g/h via INTRAVENOUS
  Filled 2012-06-19: qty 500

## 2012-06-19 MED ORDER — ACETAMINOPHEN 500 MG PO TABS
1000.0000 mg | ORAL_TABLET | Freq: Four times a day (QID) | ORAL | Status: DC | PRN
  Administered 2012-06-20: 1000 mg via ORAL
  Administered 2012-06-20 (×2): 500 mg via ORAL
  Filled 2012-06-19: qty 1
  Filled 2012-06-19: qty 2
  Filled 2012-06-19: qty 1

## 2012-06-19 MED ORDER — LACTATED RINGERS IV SOLN
INTRAVENOUS | Status: DC
  Administered 2012-06-19 – 2012-06-20 (×2): via INTRAVENOUS

## 2012-06-19 NOTE — Consult Note (Signed)
Cardiology IP Consult  Reason for Consult: Sinus Bradycardia Referring Physician: Dr. Su Hilt  HPI: Ms. Pizzolato is a 31 y.o.female admitted at [facility] to the [facility] service for treatment of postpartum preeclampsia diagnosed by BP >140/90 and proteinurea of 0.350g/24hrs.   She developed a headache over the last few days as well as hypertension.  She is otherwise asymptomatic without SOB or chest pain.  She has a h/o postpartum preeclampsia with her first child a little less than 2 years ago.  Other than her h/o migraines she is healthy.  She is active and runs a fair amount.  I was called to evaluate due to sinus bradycardia noted prior to giving IV magnesium for pre-eclampsia.  She has no h/o of cardiac problems   Past Medical History  Diagnosis Date  . GBS (group B streptococcus) infection 2007  . H/O varicella   . Hx of migraines   . Cystitis     x 2  . Anemia     as a teen     Past Surgical History  Procedure Date  . Tubal ligation 06/14/2012    Procedure: POST PARTUM TUBAL LIGATION;  Surgeon: Jansen Sciuto Litter, MD;  Location: [facility];  Service: Gynecology;  Laterality: Bilateral;  . No past surgeries     Family History  Problem Relation Age of Onset  . Other Neg Hx     Social History:  reports that she has never smoked. She has never used smokeless tobacco. She reports that she does not drink alcohol or use illicit drugs.  Allergies:  Allergies  Allergen Reactions  . Bactrim (Sulfamethoxazole W-Trimethoprim) Other (See Comments)    Flu like symptoms  . Latex     Headache    Current Facility-Administered Medications  Medication Dose Route Frequency Provider Last Rate Last Dose  . acetaminophen (TYLENOL) tablet 1,000 mg  1,000 mg Oral Q6H PRN Candice H Steelman, CNM      . ibuprofen (ADVIL,MOTRIN) tablet 600 mg  600 mg Oral Q6H PRN Candice H Steelman, CNM        ROS: A full review of systems is obtained and is negative except as noted in the HPI.  Physical  Exam: Blood pressure 138/76, pulse 53, temperature 98.9 F (37.2 C), temperature source Oral, resp. rate 21, height 5\' 3"  (1.6 m), weight 87.045 kg (191 lb 14.4 oz), last menstrual period 09/18/2011, SpO2 100.00%, currently breastfeeding.  GENERAL: no acute distress.  EYES: Extra ocular movements are intact. There is no lid lag. Sclera is anicteric.  ENT: Oropharynx is clear. Dentition is within normal limits.  NECK: Supple. The thyroid is not enlarged.  LYMPH: There are no masses or lymphadenopathy present.  HEART: Regular rate and rhythm with no m/g/r.  Normal S1/S2. No JVD LUNGS: Clear to auscultation There are no rales, rhonchi, or wheezes.  ABDOMEN: Soft, non-tender, and non-distended with normoactive bowel sounds. There is no hepatosplenomegaly.  EXTREMITIES: No clubbing, cyanosis, or edema.  PULSES:  DP/PT pulses were +2 and equal bilaterally.  SKIN: Warm, dry, and intact.  NEUROLOGIC: The patient was oriented to person, place, and time. No overt neurologic deficits were detected.  PSYCH: Normal judgment and insight, mood is appropriate.    Results: Results for orders placed during the hospital encounter of 06/19/12 (from the past 24 hour(s))  URINALYSIS, ROUTINE W REFLEX MICROSCOPIC     Status: Abnormal   Collection Time   06/19/12  5:40 PM      Component Value Range  Color, Urine STRAW (*) YELLOW   APPearance CLEAR  CLEAR   Specific Gravity, Urine <1.005 (*) 1.005 - 1.030   pH 7.0  5.0 - 8.0   Glucose, UA NEGATIVE  NEGATIVE mg/dL   Hgb urine dipstick NEGATIVE  NEGATIVE   Bilirubin Urine NEGATIVE  NEGATIVE   Ketones, ur NEGATIVE  NEGATIVE mg/dL   Protein, ur NEGATIVE  NEGATIVE mg/dL   Urobilinogen, UA 0.2  0.0 - 1.0 mg/dL   Nitrite NEGATIVE  NEGATIVE   Leukocytes, UA NEGATIVE  NEGATIVE  CBC     Status: Abnormal   Collection Time   06/19/12  5:41 PM      Component Value Range   WBC 9.1  4.0 - 10.5 K/uL   RBC 4.78  3.87 - 5.11 MIL/uL   Hemoglobin 11.5 (*) 12.0 - 15.0  g/dL   HCT 16.1 (*) 09.6 - 04.5 %   MCV 75.1 (*) 78.0 - 100.0 fL   MCH 24.1 (*) 26.0 - 34.0 pg   MCHC 32.0  30.0 - 36.0 g/dL   RDW 40.9  81.1 - 91.4 %   Platelets 212  150 - 400 K/uL  COMPREHENSIVE METABOLIC PANEL     Status: Abnormal   Collection Time   06/19/12  5:41 PM      Component Value Range   Sodium 139  135 - 145 mEq/L   Potassium 4.1  3.5 - 5.1 mEq/L   Chloride 106  96 - 112 mEq/L   CO2 26  19 - 32 mEq/L   Glucose, Bld 80  70 - 99 mg/dL   BUN 10  6 - 23 mg/dL   Creatinine, Ser 7.82  0.50 - 1.10 mg/dL   Calcium 9.1  8.4 - 95.6 mg/dL   Total Protein 6.0  6.0 - 8.3 g/dL   Albumin 3.1 (*) 3.5 - 5.2 g/dL   AST 39 (*) 0 - 37 U/L   ALT 61 (*) 0 - 35 U/L   Alkaline Phosphatase 116  39 - 117 U/L   Total Bilirubin 0.3  0.3 - 1.2 mg/dL   GFR calc non Af Amer >90  >90 mL/min   GFR calc Af Amer >90  >90 mL/min  LACTATE DEHYDROGENASE     Status: Normal   Collection Time   06/19/12  5:41 PM      Component Value Range   LDH 220  94 - 250 U/L  URIC ACID     Status: Normal   Collection Time   06/19/12  5:41 PM      Component Value Range   Uric Acid, Serum 6.5  2.4 - 7.0 mg/dL    EKG: Sinus bradycardia without STT abnormality  Assessment/Plan: 31 yo AAF with mild postpartum preeclampsia and baseline sinus bradycardia without evidence of conduction disease.  There are plans to give IV magnesium for seizure prophylaxis tonight.   - Magnesium has been shown at toxic levels to prolong the QRS complex and PR interval and cause heart block but these appear to be reported at levels above 10.  Although she has a resting sinus bradycardia, she does not have heart block or any evidence of conduction disease and should probably not be at any additional risk than the normal person undergoing treatment for preeclampsia.   - agree with proceeding with treatment as planned.  Discussed with Dr. Su Hilt who plans to use lower dose magnesium and follow per their protocol - If symptomatic bradycardia  during infusion, would stop infusion and treat  with IV calcium.  Any significant bradycardia should also be responsive to  IV atropine.   Najwa Spillane 06/19/2012, 9:32 PM

## 2012-06-19 NOTE — MAU Note (Signed)
Patient sent to MAU for evaluation of elevated PIH labs. Having blurred vision and has had some headaches off and on.

## 2012-06-19 NOTE — MAU Note (Signed)
Pt states was seen Sunday for postpartum PIH, no proteinuria however one liver enzyme was elevated. Was told to come in for repeat PIH eval. Does have headache that moves from left side to right side and is intermittent. Did note blurred vision Sunday. Still has mild blurred vision today. Notes left sided upper abdominal pain, however none on right.

## 2012-06-19 NOTE — Telephone Encounter (Signed)
TC to [facility].  REviewed labs and HX.  [facility] will call pt to discuss options.

## 2012-06-19 NOTE — Telephone Encounter (Signed)
Triage/epic 

## 2012-06-19 NOTE — Telephone Encounter (Signed)
TC to pt to give her result of 24 hr urine =352mg  total protein.  Per c/w Dr. Su Hilt, offered pt admission to do 24 hrs of magnesium sulfate IV, or may come to MAU for re-eval of BP, PIH labs,and send cath u/a.  Pt desires MAU eval. Pt states she needs to wait for her husband to come home, so plans to come to MAU for eval around "1700." Pt is formula-feeding.  She has been taking 12.5mg  of HCTZ po qd since Sun, but has 25mg  tablet, so rec'd pt increase dose to 25 mg.  Reports no wt loss since Sun when =195 lbs.  Has intermittent Headaches still.  No other PIH s/s.  F/u sooner if possible or prn.

## 2012-06-19 NOTE — H&P (Signed)
Michelle Curry is an 31 y.o.MBF female who presents 6 days PP s/p SVD at term on 06/13/12 for elevated 24 hr urine with 352mg  of total protein on specimen yesterday for further eval.  Pt seen in MAU Sun for recurring headaches, intermittent blurred vision (but reports tonight to Dr. Su Hilt as "constant"), weight gain, and generally not feeling like herself.  She is formula feeding.  Good help & support at home, and accompanied by her husband and her mother. Pt was normotensive while hospitalized for labor and subsequent PP period.  She did have BTL on 06/14/12.  D/c'd home Friday 7/12, and has felt "off" since then.  VB is light.  No GI or resp c/o's.  Denies chest pain or SOB, but does have mild pain, mid-clavicular region under Lt breast.  Headache is frontal and radiates from "side to side."   No UTI s/s.  No fever or chills.  Started on HCTZ 12.5mg  po qd on Sun per c/w dr. Estanislado Pandy.  AST elevated on Sun.  BP's recorded in MAU note were WNL, but borderline high.    .. OB History    Grav Para Term Preterm Abortions TAB SAB Ect Mult Living   5 4 4  1  1   4       Past Medical History  Diagnosis Date  . GBS (group B streptococcus) infection 2007  . H/O varicella   . Hx of migraines   . Cystitis     x 2  . Anemia     as a teen     Past Surgical History  Procedure Date  . Tubal ligation 06/14/2012    Procedure: POST PARTUM TUBAL LIGATION;  Surgeon: Michael Litter, MD;  Location: [facility];  Service: Gynecology;  Laterality: Bilateral;  . No past surgeries     Family History  Problem Relation Age of Onset  . Other Neg Hx     Social History:  reports that she has never smoked. She has never used smokeless tobacco. She reports that she does not drink alcohol or use illicit drugs.  Allergies:  Allergies  Allergen Reactions  . Bactrim (Sulfamethoxazole W-Trimethoprim) Other (See Comments)    Flu like symptoms  . Latex     Headache    Prescriptions prior to admission  Medication Sig  Dispense Refill  . acetaminophen (TYLENOL) 325 MG tablet Take 325 mg by mouth every 6 (six) hours as needed. Takes for pain      . hydrochlorothiazide (HYDRODIURIL) 25 MG tablet Take 0.5 tablets (12.5 mg total) by mouth daily.  7 tablet  0  . ibuprofen (ADVIL,MOTRIN) 600 MG tablet Take 1 tablet (600 mg total) by mouth every 6 (six) hours as needed for pain.  30 tablet  2  . IRON PO Take 2 tablets by mouth daily.       . Pyridoxine HCl (B-6 PO) Take 1 tablet by mouth at bedtime.        ROS .Marland Kitchen Filed Vitals:   06/19/12 2020 06/19/12 2021 06/19/12 2100 06/19/12 2200  BP:  145/83 138/76 148/84  Pulse:  48 53 49  Temp:      TempSrc:      Resp:  14 21 16   Height:  5\' 3"  (1.6 m)    Weight: 191 lb 14.4 oz (87.045 kg)     SpO2:  100% 100% 100%   Blood pressure 148/84, pulse 49, temperature 98.9 F (37.2 C), temperature source Oral, resp. rate 16, height 5'  3" (1.6 m), weight 191 lb 14.4 oz (87.045 kg), last menstrual period 09/18/2011, SpO2 100.00%, currently breastfeeding. Physical Exam  Constitutional: She is oriented to person, place, and time. She appears well-developed and well-nourished. No distress.  HENT:  Head: Normocephalic and atraumatic.  Eyes: Pupils are equal, round, and reactive to light.  Neck: Normal range of motion.  Cardiovascular: Normal rate and regular rhythm.   Respiratory: Effort normal and breath sounds normal.  GI: Soft.       FF, below umbilicus  Genitourinary:       Pelvic exam deferred  Musculoskeletal: She exhibits edema.       1+ BLE pitting edema;   Neurological: She is alert and oriented to person, place, and time. She has normal reflexes.       No clonus  Skin: Skin is warm and dry.  Psychiatric: She has a normal mood and affect. Her behavior is normal. Judgment and thought content normal.    Results for orders placed during the hospital encounter of 06/19/12 (from the past 24 hour(s))  URINALYSIS, ROUTINE W REFLEX MICROSCOPIC     Status: Abnormal     Collection Time   06/19/12  5:40 PM      Component Value Range   Color, Urine STRAW (*) YELLOW   APPearance CLEAR  CLEAR   Specific Gravity, Urine <1.005 (*) 1.005 - 1.030   pH 7.0  5.0 - 8.0   Glucose, UA NEGATIVE  NEGATIVE mg/dL   Hgb urine dipstick NEGATIVE  NEGATIVE   Bilirubin Urine NEGATIVE  NEGATIVE   Ketones, ur NEGATIVE  NEGATIVE mg/dL   Protein, ur NEGATIVE  NEGATIVE mg/dL   Urobilinogen, UA 0.2  0.0 - 1.0 mg/dL   Nitrite NEGATIVE  NEGATIVE   Leukocytes, UA NEGATIVE  NEGATIVE  CBC     Status: Abnormal   Collection Time   06/19/12  5:41 PM      Component Value Range   WBC 9.1  4.0 - 10.5 K/uL   RBC 4.78  3.87 - 5.11 MIL/uL   Hemoglobin 11.5 (*) 12.0 - 15.0 g/dL   HCT 16.1 (*) 09.6 - 04.5 %   MCV 75.1 (*) 78.0 - 100.0 fL   MCH 24.1 (*) 26.0 - 34.0 pg   MCHC 32.0  30.0 - 36.0 g/dL   RDW 40.9  81.1 - 91.4 %   Platelets 212  150 - 400 K/uL  COMPREHENSIVE METABOLIC PANEL     Status: Abnormal   Collection Time   06/19/12  5:41 PM      Component Value Range   Sodium 139  135 - 145 mEq/L   Potassium 4.1  3.5 - 5.1 mEq/L   Chloride 106  96 - 112 mEq/L   CO2 26  19 - 32 mEq/L   Glucose, Bld 80  70 - 99 mg/dL   BUN 10  6 - 23 mg/dL   Creatinine, Ser 7.82  0.50 - 1.10 mg/dL   Calcium 9.1  8.4 - 95.6 mg/dL   Total Protein 6.0  6.0 - 8.3 g/dL   Albumin 3.1 (*) 3.5 - 5.2 g/dL   AST 39 (*) 0 - 37 U/L   ALT 61 (*) 0 - 35 U/L   Alkaline Phosphatase 116  39 - 117 U/L   Total Bilirubin 0.3  0.3 - 1.2 mg/dL   GFR calc non Af Amer >90  >90 mL/min   GFR calc Af Amer >90  >90 mL/min  LACTATE DEHYDROGENASE  Status: Normal   Collection Time   06/19/12  5:41 PM      Component Value Range   LDH 220  94 - 250 U/L  URIC ACID     Status: Normal   Collection Time   06/19/12  5:41 PM      Component Value Range   Uric Acid, Serum 6.5  2.4 - 7.0 mg/dL    Ct Head Wo Contrast  06/19/2012  *RADIOLOGY REPORT*  Clinical Data: Headache, preeclampsia, bradycardia  CT HEAD WITHOUT  CONTRAST  Technique:  Contiguous axial images were obtained from the base of the skull through the vertex without contrast.  Comparison: 08/29/2009  Findings: No acute hemorrhage, acute infarction, or mass lesion is identified.  No midline shift.  No ventriculomegaly.  No skull fracture.  No midline shift.  Orbits and paranasal sinuses are intact.  IMPRESSION: No acute intracranial finding.  Original Report Authenticated By: Harrel Lemon, M.D.    Assessment/Plan: 1.  6days PP 2.  Preeclampsia  3.  Marked sinus bradycardia on EKG 4.  Elevated LFT's 5.  Formula-feeding 6.  Recurring headaches and visual disturbances   1.  Admitted to AICU w/ Dr. Su Hilt as attending for continuous telemetry monitoring 2.  Dr. Su Hilt c/w o/c cardiologist, Dr. Donnie Aho, and pt has received inpatient consult since in AICU by Dr. Charm Barges 3.  Dr. Su Hilt, following cardio consult, ordered Magnesium sulfate to run at 1mg /hr tonight, and adjust or d/c if pt w/ symptomatic bradycardia or other concerns (see Dr. Silvana Newness consult note) 4.  Strict I&O's, daily weights, repeat PIH labs at [facility]; pain medication prn  5.  MD to follow  STEELMAN,CANDICE H 06/19/2012, 11:00 PM  Agree with above.  Lengthy (about 1 1/2 hr) discussion with pt, family and cardiologist.  Head CT was neg.  I decided to get that testing done secondary to pt's c/o persistent blurred vision since delivery with HAs.  Will start MgSO4 at very lose dose relatively speaking and observe for sxs and side effect secondary to bradycardia (0.5mg /hr x 2hrs then 1g/hr).  Will recheck labs at midnight and in AM.  Will hold on hctz for right now.

## 2012-06-20 ENCOUNTER — Encounter (HOSPITAL_COMMUNITY): Payer: Self-pay

## 2012-06-20 DIAGNOSIS — O1490 Unspecified pre-eclampsia, unspecified trimester: Secondary | ICD-10-CM

## 2012-06-20 HISTORY — DX: Unspecified pre-eclampsia, unspecified trimester: O14.90

## 2012-06-20 LAB — COMPREHENSIVE METABOLIC PANEL
ALT: 49 U/L — ABNORMAL HIGH (ref 0–35)
AST: 24 U/L (ref 0–37)
BUN: 10 mg/dL (ref 6–23)
CO2: 23 mEq/L (ref 19–32)
Calcium: 9 mg/dL (ref 8.4–10.5)
Chloride: 107 mEq/L (ref 96–112)
Creatinine, Ser: 0.62 mg/dL (ref 0.50–1.10)
GFR calc Af Amer: >90 mL/min (ref >90)
GFR calc non Af Amer: >90 mL/min (ref >90)
Sodium: 140 mEq/L (ref 135–145)
Total Bilirubin: 0.3 mg/dL (ref 0.3–1.2)
Total Protein: 5.2 g/dL — ABNORMAL LOW (ref 6.0–8.3)

## 2012-06-20 LAB — CBC
HCT: 34.3 % — ABNORMAL LOW (ref 36.0–46.0)
MCH: 23.8 pg — ABNORMAL LOW (ref 26.0–34.0)
MCHC: 31.7 g/dL (ref 30.0–36.0)
MCV: 74.9 fL — ABNORMAL LOW (ref 78.0–100.0)
Platelets: 227 10*3/uL (ref 150–400)
RDW: 14 % (ref 11.5–15.5)
RDW: 14.1 % (ref 11.5–15.5)
WBC: 9.4 10*3/uL (ref 4.0–10.5)

## 2012-06-20 LAB — URIC ACID: Uric Acid, Serum: 6.9 mg/dL (ref 2.4–7.0)

## 2012-06-20 LAB — LACTATE DEHYDROGENASE: LDH: 191 U/L (ref 94–250)

## 2012-06-20 NOTE — Progress Notes (Signed)
Post Partum Day 7 with preeclampsia Subjective: no complaints, tolerating PO and mild headache.  no blurred vision or RUQ pain  Objective: Blood pressure 121/78, pulse 68, temperature 98.1 F (36.7 C), temperature source Oral, resp. rate 19, height 5\' 3"  (1.6 m), weight 184 lb 1.6 oz (83.507 kg), last menstrual period 09/18/2011, SpO2 99.00%, currently breastfeeding.  Physical Exam:  General: alert and cooperative Lochia: appropriate Uterine Fundus: firm Incision: healing well DVT Evaluation: No evidence of DVT seen on physical exam. 2 plus DTRB. No clonus B CV RRR LUNGS CTAB   Basename 06/20/12 0530 06/20/12 0015  HGB 11.0* 10.8*  HCT 34.7* 34.3*    Assessment/Plan: Pt stable.  Increase magnesium to  2 grams per hour.  Stop magnesium at midnight.  Will check with cardiology to see what is best BP med to use once magnesium is stopped.  Check PIH labs in the am with a magnesium level   LOS: 1 day   Michelle Curry A 06/20/2012, 3:16 PM

## 2012-06-20 NOTE — Progress Notes (Signed)
UR chart review completed.  

## 2012-06-21 ENCOUNTER — Encounter: Payer: Self-pay | Admitting: Obstetrics and Gynecology

## 2012-06-21 LAB — COMPREHENSIVE METABOLIC PANEL
ALT: 43 U/L — ABNORMAL HIGH (ref 0–35)
Alkaline Phosphatase: 103 U/L (ref 39–117)
CO2: 26 mEq/L (ref 19–32)
Calcium: 7.8 mg/dL — ABNORMAL LOW (ref 8.4–10.5)
Chloride: 106 mEq/L (ref 96–112)
GFR calc Af Amer: >90 mL/min (ref >90)
GFR calc non Af Amer: >90 mL/min (ref >90)
Glucose, Bld: 85 mg/dL (ref 70–99)
Sodium: 139 mEq/L (ref 135–145)
Total Bilirubin: 0.5 mg/dL (ref 0.3–1.2)

## 2012-06-21 LAB — CBC WITH DIFFERENTIAL/PLATELET
Eosinophils Relative: 1 % (ref 0–5)
HCT: 36.1 % (ref 36.0–46.0)
Lymphocytes Relative: 22 % (ref 12–46)
Lymphs Abs: 1.7 10*3/uL (ref 0.7–4.0)
MCV: 74.3 fL — ABNORMAL LOW (ref 78.0–100.0)
Platelets: 242 10*3/uL (ref 150–400)
RBC: 4.86 MIL/uL (ref 3.87–5.11)
WBC: 7.8 10*3/uL (ref 4.0–10.5)

## 2012-06-21 LAB — MAGNESIUM: Magnesium: 3.1 mg/dL — ABNORMAL HIGH (ref 1.5–2.5)

## 2012-06-21 MED ORDER — HYDROCHLOROTHIAZIDE 25 MG PO TABS
25.0000 mg | ORAL_TABLET | Freq: Every day | ORAL | Status: DC
  Administered 2012-06-21: 25 mg via ORAL
  Filled 2012-06-21: qty 1

## 2012-06-21 MED ORDER — HYDROCHLOROTHIAZIDE 25 MG PO TABS: 25.0000 mg | ORAL_TABLET | Freq: Every day | ORAL | Status: DC

## 2012-06-21 NOTE — Progress Notes (Signed)
Patient given dose of Hctz and discharge papers given and reviewed and questions answered. Patient and her mom walked out with RN with belongings.

## 2012-06-21 NOTE — Progress Notes (Signed)
Run of    VTach noted at [facility].  Pt states resting comfortably and then felt "like heart beating real fast" for a few seconds then back to normal.   No c/o at this time.   O2 sat 100%.  Pulse 60.  BP 145/87.

## 2012-06-21 NOTE — Progress Notes (Signed)
Post Partum Day 8  PP Pre-eclampsia Subjective:  Feeling much better. No headache. Improved blurred vision. Net improvement in edema. Had a short episode of ventricular tachycardia yesterday which resolved spontaneously and rapidly. Previously investigated with Holter: normal.  Objective: Blood pressure 143/90, pulse 54, temperature 98.4 F (36.9 C), temperature source Oral, resp. rate 16, height 5\' 3"  (1.6 m), weight 177 lb 6.4 oz (80.468 kg), last menstrual period 09/18/2011, SpO2 100.00%, currently breastfeeding.  Physical Exam:  General: normal Heart: normal Lungs: clear Extremities: No evidence of DVT seen on physical exam. Edema is minimal     Basename 06/21/12 0510 06/20/12 0530  HGB 11.6* 11.0*  HCT 36.1 34.7*    Assessment/Plan: Post-partum pre-eclampsia: S/P 24 hour MgSO4 Mild HTN D/C home with HCTZ 25 mg daily for 7 days Home BP with [facility] nurse tomorrow and 06/25/12   LOS: 2 days   Danyela Posas A MD 06/21/2012, 10:18 AM

## 2012-06-21 NOTE — Discharge Summary (Signed)
Discharge Summary  Reason for Admission: post-partum pre-eclampsia Intrapartum Procedures: spontaneous vaginal delivery 06/13/12 Postpartum Procedures: P.P. tubal ligation  HGB  Date Value Range Status  08/31/2011 11.5* 11.6 - 15.9 g/dL Final     Hemoglobin  Date Value Range Status  06/21/2012 11.6* 12.0 - 15.0 g/dL Final     HCT  Date Value Range Status  06/21/2012 36.1  36.0 - 46.0 % Final  08/31/2011 36.1  34.8 - 46.6 % Final    Discharge Diagnoses: Post-partum Preelampsia  Discharge Information: patient readmitted 06/19/12 with proteinuria of 352 mg/24 hours. Magnesium sulfate therapy for 24 hours. Mild elevation of liver enzymes was improving by discharge. Normal head CT-scan.Discharged with HCTZ 25 mg daily for 7 days and [facility] nurse visit 06/22/12 and 06/25/12  Date: 06/21/2012 Activity: unrestricted Diet: routine Medications: HCTZ 25 mg daily for 7 days Condition: stable   Discharge to: home     [facility] A MD 06/21/2012, 4:06 PM

## 2012-06-22 ENCOUNTER — Telehealth: Payer: Self-pay | Admitting: Obstetrics and Gynecology

## 2012-06-22 NOTE — Telephone Encounter (Signed)
Message taken from [place], [employer] nurse by Renee Harder.  Message states pt's BP 132/78.   Reviewed with Dr Maryellen Pile.  LM for Joy to call with any problems or symptoms pt is having.  If none, no further F/U needed.  Also reminded to speak with nurse or clinical person whenever calling in BP results.

## 2012-06-22 NOTE — Telephone Encounter (Signed)
Triage/epic 

## 2012-06-25 ENCOUNTER — Telehealth: Payer: Self-pay | Admitting: Obstetrics and Gynecology

## 2012-06-25 NOTE — Telephone Encounter (Signed)
TC from [employer].  Pt's BP 102/68.  Is taking HCTZ 25 mg.  Pt is unsure how long to continue.  Had episode of dizziness with standing 06/24/12 No PIH sx.  Per Dr AVS to complete 7 days of HCTZ. Be sure is well hydrated.  Given precautions for postural hypotension.  States is feeling better. To call with any concerns. Pt verbalizes comprehension.

## 2012-07-04 ENCOUNTER — Telehealth: Payer: Self-pay | Admitting: Obstetrics and Gynecology

## 2012-07-04 ENCOUNTER — Encounter (HOSPITAL_COMMUNITY): Payer: Self-pay | Admitting: *Deleted

## 2012-07-04 ENCOUNTER — Inpatient Hospital Stay (HOSPITAL_COMMUNITY)
Admission: AD | Admit: 2012-07-04 | Discharge: 2012-07-04 | Disposition: A | Discharge status: Home / Self Care | Payer: Medicaid Other | Source: Ambulatory Visit | Attending: Obstetrics and Gynecology | Admitting: Obstetrics and Gynecology

## 2012-07-04 DIAGNOSIS — O135 Gestational [pregnancy-induced] hypertension without significant proteinuria, complicating the puerperium: Secondary | ICD-10-CM | POA: Insufficient documentation

## 2012-07-04 DIAGNOSIS — R001 Bradycardia, unspecified: Secondary | ICD-10-CM

## 2012-07-04 DIAGNOSIS — I498 Other specified cardiac arrhythmias: Secondary | ICD-10-CM | POA: Insufficient documentation

## 2012-07-04 DIAGNOSIS — O165 Unspecified maternal hypertension, complicating the puerperium: Secondary | ICD-10-CM

## 2012-07-04 DIAGNOSIS — IMO0001 Reserved for inherently not codable concepts without codable children: Secondary | ICD-10-CM

## 2012-07-04 DIAGNOSIS — IMO0002 Reserved for concepts with insufficient information to code with codable children: Secondary | ICD-10-CM | POA: Insufficient documentation

## 2012-07-04 DIAGNOSIS — O99893 Other specified diseases and conditions complicating puerperium: Secondary | ICD-10-CM | POA: Insufficient documentation

## 2012-07-04 DIAGNOSIS — R0989 Other specified symptoms and signs involving the circulatory and respiratory systems: Secondary | ICD-10-CM

## 2012-07-04 DIAGNOSIS — D649 Anemia, unspecified: Secondary | ICD-10-CM | POA: Insufficient documentation

## 2012-07-04 DIAGNOSIS — O9081 Anemia of the puerperium: Secondary | ICD-10-CM | POA: Insufficient documentation

## 2012-07-04 LAB — COMPREHENSIVE METABOLIC PANEL
AST: 15 U/L (ref 0–37)
BUN: 9 mg/dL (ref 6–23)
CO2: 27 mEq/L (ref 19–32)
Calcium: 9.8 mg/dL (ref 8.4–10.5)
Chloride: 104 mEq/L (ref 96–112)
Creatinine, Ser: 0.81 mg/dL (ref 0.50–1.10)
GFR calc Af Amer: >90 mL/min (ref >90)
GFR calc non Af Amer: >90 mL/min (ref >90)
Total Bilirubin: 0.3 mg/dL (ref 0.3–1.2)

## 2012-07-04 LAB — URINE MICROSCOPIC-ADD ON

## 2012-07-04 LAB — LACTATE DEHYDROGENASE: LDH: 173 U/L (ref 94–250)

## 2012-07-04 LAB — CBC WITH DIFFERENTIAL/PLATELET
Basophils Absolute: 0 10*3/uL (ref 0.0–0.1)
Basophils Relative: 0 % (ref 0–1)
HCT: 41.7 % (ref 36.0–46.0)
Hemoglobin: 13 g/dL (ref 12.0–15.0)
Lymphocytes Relative: 33 % (ref 12–46)
Lymphs Abs: 2.5 10*3/uL (ref 0.7–4.0)
MCHC: 31.2 g/dL (ref 30.0–36.0)
MCV: 74.1 fL — ABNORMAL LOW (ref 78.0–100.0)
Monocytes Relative: 5 % (ref 3–12)
Neutro Abs: 4.6 10*3/uL (ref 1.7–7.7)
RDW: 12.8 % (ref 11.5–15.5)

## 2012-07-04 LAB — POCT I-STAT TROPONIN I

## 2012-07-04 LAB — URINALYSIS, ROUTINE W REFLEX MICROSCOPIC
Glucose, UA: NEGATIVE mg/dL
Ketones, ur: NEGATIVE mg/dL
Protein, ur: NEGATIVE mg/dL
pH: 6 (ref 5.0–8.0)

## 2012-07-04 LAB — URIC ACID: Uric Acid, Serum: 4.8 mg/dL (ref 2.4–7.0)

## 2012-07-04 NOTE — ED Notes (Signed)
Pt with hx of post-pardum eclampsia was told to come here from [facility] to be seen by a cardiologist d/t a hr of 47 (no ekg was taken at [facility]).  Pt states she delivered on 7/10 and was tx with hctz until 7/24 (she was released from icu where she had exp V-tach for several seconds).  Today she felt "funny" and went to [facility].  Her bp was 167/96 at [facility], but all of her labs were normal.  Presently pt feels "light-headed" with blurred vision.

## 2012-07-04 NOTE — MAU Note (Signed)
Patient states she had a SVD on 7-10 and had gestational hypertension. States she has not been feeling well for several days. Has continued to feel dizzy and lightheaded with a headache off and on.

## 2012-07-04 NOTE — Telephone Encounter (Signed)
TC to pt.  States complete HCTZ 06/27/12.  Starting 07/03/12 was not feeling well. Today still feels lightheaded, dizzy, Bad headache, and "funny in the chest", somewhat like indigestion. Took BP at home: 137/85,144/88,139/90.  No edema.  Still has same blurred vision since delivery.   Per VL.Pt to MAU. Pt verbalizes comprehension.

## 2012-07-04 NOTE — Telephone Encounter (Signed)
Triage/epic 

## 2012-07-04 NOTE — MAU Provider Note (Signed)
History   Michelle Curry is a 30y.o. MBF who presents 3 weeks PP s/p SVD 06/13/12 for recurrence of elevated BP.  Pt's hx is remarkable for PP PreEclampsia, dx'd 06/19/12, and was admitted to [facility] [place] and treated w/ Mag sulfate for 24 hrs; pt also had cardiology consult for bradycardia before magnesium started by Dr. Charm Barges (Dr. Su Hilt had also talked w/ Dr. Donnie Aho).  She was d/c'd home on 7/18 on HCTZ for 7 days and has been off it since last Wed (1 week), but started feeling "funny" over the weekend and having recurrence of intermittent headaches, and today noticing some possible palpitations in her chest.  She denies any SOB, fever, other resp c/o's; no GI complaints, no urinary s/s. PP lochia is now pinkish spotting.  She is formula feeding and newborn is doing well.  Pt is s/p BTL on 06/14/12.  Her BP was normotensive intrapartum and immediate PP.  Her hx is s/f PP PreEclampsia w/ this delivery and last delivery.  She is accompanied by her husband and her mother.  Her mother-in-law is with her children at home.  She reports normal appetite and po intake.  She has roughly lost about 20 lbs since d/c'd home from AICU.   .. Patient Active Problem List  Diagnosis  . Heart murmur  . H/O Anemia  . Migraines  . Hernia, abdominal  . Son with Arthrogryposis  . Nuchal cord, delivered, current hospitalization  . Preeclampsia  . Bradycardia    CSN: [id number]  Arrival date and time: 07/04/12 1833   None     Chief Complaint  Patient presents with  . Hypertension   HPI  OB History    Grav Para Term Preterm Abortions TAB SAB Ect Mult Living   5 4 4  1  1   4       Past Medical History  Diagnosis Date  . GBS (group B streptococcus) infection 2007  . H/O varicella   . Hx of migraines   . Cystitis     x 2  . Anemia     as a teen   . Preeclampsia 06/20/2012  . Bradycardia 06/19/2012    Past Surgical History  Procedure Date  . Tubal ligation 06/14/2012    Procedure: POST PARTUM TUBAL LIGATION;   Surgeon: Michael Litter, MD;  Location: [facility];  Service: Gynecology;  Laterality: Bilateral;  . No past surgeries     Family History  Problem Relation Age of Onset  . Other Neg Hx     History  Substance Use Topics  . Smoking status: Never Smoker   . Smokeless tobacco: Never Used  . Alcohol Use: No    Allergies:  Allergies  Allergen Reactions  . Bactrim (Sulfamethoxazole W-Trimethoprim) Other (See Comments)    Flu like symptoms  . Latex     Headache    Prescriptions prior to admission  Medication Sig Dispense Refill  . acetaminophen (TYLENOL) 325 MG tablet Take 325 mg by mouth every 6 (six) hours as needed. Takes for pain      . Pyridoxine HCl (B-6 PO) Take 1 tablet by mouth at bedtime.      Marland Kitchen DISCONTD: hydrochlorothiazide (HYDRODIURIL) 25 MG tablet Take 1 tablet (25 mg total) by mouth daily.  7 tablet  0  . DISCONTD: IRON PO Take 2 tablets by mouth daily.         ROS--see HPI Physical Exam   Blood pressure 145/84, pulse 50, temperature 98.7 F (37.1 C), temperature  source Oral, resp. rate 16, SpO2 100.00%, currently breastfeeding. .. Filed Vitals:   07/04/12 1933 07/04/12 2019 07/04/12 2033 07/04/12 2048  BP: 161/83 141/96 144/93 145/84  Pulse: 56 60 47 50  Temp:      TempSrc:      Resp:      SpO2:      .Marland Kitchen Results for orders placed during the hospital encounter of 07/04/12 (from the past 24 hour(s))  CBC WITH DIFFERENTIAL     Status: Abnormal   Collection Time   07/04/12  6:57 PM      Component Value Range   WBC 7.6  4.0 - 10.5 K/uL   RBC 5.63 (*) 3.87 - 5.11 MIL/uL   Hemoglobin 13.0  12.0 - 15.0 g/dL   HCT 40.9  81.1 - 91.4 %   MCV 74.1 (*) 78.0 - 100.0 fL   MCH 23.1 (*) 26.0 - 34.0 pg   MCHC 31.2  30.0 - 36.0 g/dL   RDW 78.2  95.6 - 21.3 %   Platelets 200  150 - 400 K/uL   Neutrophils Relative 61  43 - 77 %   Lymphocytes Relative 33  12 - 46 %   Monocytes Relative 5  3 - 12 %   Eosinophils Relative 1  0 - 5 %   Basophils Relative 0  0 - 1 %    Neutro Abs 4.6  1.7 - 7.7 K/uL   Lymphs Abs 2.5  0.7 - 4.0 K/uL   Monocytes Absolute 0.4  0.1 - 1.0 K/uL   Eosinophils Absolute 0.1  0.0 - 0.7 K/uL   Basophils Absolute 0.0  0.0 - 0.1 K/uL  COMPREHENSIVE METABOLIC PANEL     Status: Abnormal   Collection Time   07/04/12  6:57 PM      Component Value Range   Sodium 138  135 - 145 mEq/L   Potassium 4.5  3.5 - 5.1 mEq/L   Chloride 104  96 - 112 mEq/L   CO2 27  19 - 32 mEq/L   Glucose, Bld 116 (*) 70 - 99 mg/dL   BUN 9  6 - 23 mg/dL   Creatinine, Ser 0.86  0.50 - 1.10 mg/dL   Calcium 9.8  8.4 - 57.8 mg/dL   Total Protein 6.7  6.0 - 8.3 g/dL   Albumin 3.9  3.5 - 5.2 g/dL   AST 15  0 - 37 U/L   ALT 12  0 - 35 U/L   Alkaline Phosphatase 94  39 - 117 U/L   Total Bilirubin 0.3  0.3 - 1.2 mg/dL   GFR calc non Af Amer >90  >90 mL/min   GFR calc Af Amer >90  >90 mL/min  URIC ACID     Status: Normal   Collection Time   07/04/12  6:57 PM      Component Value Range   Uric Acid, Serum 4.8  2.4 - 7.0 mg/dL  LACTATE DEHYDROGENASE     Status: Normal   Collection Time   07/04/12  6:57 PM      Component Value Range   LDH 173  94 - 250 U/L  URINALYSIS, ROUTINE W REFLEX MICROSCOPIC     Status: Abnormal   Collection Time   07/04/12  7:05 PM      Component Value Range   Color, Urine YELLOW  YELLOW   APPearance CLEAR  CLEAR   Specific Gravity, Urine 1.010  1.005 - 1.030   pH 6.0  5.0 - 8.0  Glucose, UA NEGATIVE  NEGATIVE mg/dL   Hgb urine dipstick SMALL (*) NEGATIVE   Bilirubin Urine NEGATIVE  NEGATIVE   Ketones, ur NEGATIVE  NEGATIVE mg/dL   Protein, ur NEGATIVE  NEGATIVE mg/dL   Urobilinogen, UA 0.2  0.0 - 1.0 mg/dL   Nitrite NEGATIVE  NEGATIVE   Leukocytes, UA TRACE (*) NEGATIVE  URINE MICROSCOPIC-ADD ON     Status: Abnormal   Collection Time   07/04/12  7:05 PM      Component Value Range   Squamous Epithelial / LPF RARE  RARE   WBC, UA 0-2  <3 WBC/hpf   RBC / HPF 0-2  <3 RBC/hpf   Bacteria, UA FEW (*) RARE   Physical Exam    Constitutional: She is oriented to person, place, and time. She appears well-developed and well-nourished. No distress.  HENT:  Head: Normocephalic and atraumatic.  Eyes: Pupils are equal, round, and reactive to light.  Cardiovascular: Normal rate.   Respiratory: Effort normal.  GI: Soft. Bowel sounds are normal. She exhibits no distension and no mass. There is no tenderness. There is no rebound and no guarding.  Genitourinary:       deferred  Musculoskeletal: Normal range of motion. She exhibits no edema.  Neurological: She is alert and oriented to person, place, and time. She has normal reflexes.  Skin: Skin is warm and dry.  Psychiatric: She has a normal mood and affect. Her behavior is normal. Judgment and thought content normal.       Baseline anxiety; easily worries    MAU Course  Procedures 1.  U/a 2.  CMET, CBC, LDH, Uric acid 3.  Serial BP's  Assessment and Plan  1.  3 weeks PP 2.  Persisting HTN  3.  H/o PP PreEclampsia w/ elevated LFT's 7/16, but normal PIH labs today and s/p Magnesium sulfate treatment 7/16-7/17 in AICU 4.  Bradycardia PP 5.  BP's more elevated on arrival, but did improve w/ rest  1. Consulted w/ Dr. Leonard Schwartz and he rec'd pt been seen by cardiologist or PCP for further management and does not feel HTN is any longer r/t PreEclampsia.  He gave option of pt being seen at [employer] [facility] (private transfer or by [employer] [facility]), or scheduling Outpt first available appt; options rev'd w/ pt and family, and for "peace of mind" and "childcare settled for the night," pt desired to go over to [facility] (transport by her family) for further eval. 2.  Spoke w/ charge RN, "Woody," and given report r/e pt's status and hx 3.  F/u 3 weeks at office, or prn    Avanish Cerullo H 07/04/2012, 9:13 PM

## 2012-07-05 ENCOUNTER — Emergency Department (HOSPITAL_COMMUNITY)
Admission: EM | Admit: 2012-07-05 | Discharge: 2012-07-05 | Disposition: A | Discharge status: Home / Self Care | Payer: Medicaid Other | Attending: Emergency Medicine | Admitting: Emergency Medicine

## 2012-07-05 ENCOUNTER — Telehealth: Payer: Self-pay | Admitting: Obstetrics and Gynecology

## 2012-07-05 DIAGNOSIS — O1490 Unspecified pre-eclampsia, unspecified trimester: Secondary | ICD-10-CM

## 2012-07-05 MED ORDER — HYDRALAZINE HCL 20 MG/ML IJ SOLN
10.0000 mg | INTRAMUSCULAR | Status: AC
  Administered 2012-07-05: 10 mg via INTRAVENOUS
  Filled 2012-07-05: qty 1

## 2012-07-05 MED ORDER — AMLODIPINE BESYLATE 10 MG PO TABS: 10.0000 mg | ORAL_TABLET | Freq: Every day | ORAL | Status: DC

## 2012-07-05 NOTE — Telephone Encounter (Signed)
Returned pt's VM. States was seen at MAU and then [facility] ER last PM for increased BP and low pulse. After consult with cardiologist was given Rx for Norvasc 10 mg. Consult with Dr Maryellen Pile.  To have [facility] check BP 07/06/12. As long as not over 160/105 to continue on Norvasc and keep PP appt.  TC to pt. States just took first does of Norvasc this afternoon per instructions from ER.  Will contact Joy form [facility] to recheck BP.  Tc to Joy who states who recheck 07/06/12.

## 2012-07-05 NOTE — ED Notes (Signed)
Pt states understanding of discharge instructions 

## 2012-07-05 NOTE — ED Provider Notes (Signed)
History     CSN: [id number]  Arrival date & time 07/04/12  2132   First MD Initiated Contact with Patient 07/05/12 0142      Chief Complaint  Patient presents with  . Bradycardia    (Consider location/radiation/quality/duration/timing/severity/associated sxs/prior treatment) HPI Comments: 31 year old female with a history of postpartum preeclampsia which was identified approximately 2 weeks ago who presents with a complaint of hypertension. She has already been seen at the [facility] hospital this evening, had normal blood work and no protein in the urine and decided that she wanted further evaluation for her blood pressure this evening. It was recommended that she come here if she wanted that further evaluation. According to the report from the nursing staff the [facility] hospital staff felt that the patient was stable to followup with family doctor or OB/GYN in the morning. Blood pressures were reportedly in the 160 systolic range prior to arrival. The patient has been having mild lightheadedness, headache and some intermittent blurry vision since her delivery, she has had a CT scan of her head and liver function tests which were both normal. She is not currently taking any antihypertensives. She did take hydrochlorothiazide for approximately one week but has not had this in 7 days.  The history is provided by the patient.    Past Medical History  Diagnosis Date  . GBS (group B streptococcus) infection 2007  . H/O varicella   . Hx of migraines   . Cystitis     x 2  . Anemia     as a teen   . Preeclampsia 06/20/2012  . Bradycardia 06/19/2012    Past Surgical History  Procedure Date  . Tubal ligation 06/14/2012    Procedure: POST PARTUM TUBAL LIGATION;  Surgeon: Michael Litter, MD;  Location: [facility];  Service: Gynecology;  Laterality: Bilateral;  . No past surgeries     Family History  Problem Relation Age of Onset  . Other Neg Hx     History  Substance Use Topics  . Smoking  status: Never Smoker   . Smokeless tobacco: Never Used  . Alcohol Use: No    OB History    Grav Para Term Preterm Abortions TAB SAB Ect Mult Living   5 4 4  1  1   4       Review of Systems  All other systems reviewed and are negative.    Allergies  Bactrim and Latex  Home Medications   Current Outpatient Rx  Name Route Sig Dispense Refill  . ACETAMINOPHEN 325 MG PO TABS Oral Take 325 mg by mouth every 6 (six) hours as needed. Takes for pain    . IRON PO Oral Take 2 tablets by mouth at bedtime.    Marland Kitchen B-6 PO Oral Take 1 tablet by mouth at bedtime.    Marland Kitchen AMLODIPINE BESYLATE 10 MG PO TABS Oral Take 1 tablet (10 mg total) by mouth daily. 30 tablet 0    BP 145/88  Pulse 53  Temp 98.2 F (36.8 C) (Oral)  Resp 14  SpO2 98%  Breastfeeding? Yes  Physical Exam  Nursing note and vitals reviewed. Constitutional: She appears well-developed and well-nourished. No distress.  HENT:  Head: Normocephalic and atraumatic.  Mouth/Throat: Oropharynx is clear and moist. No oropharyngeal exudate.  Eyes: Conjunctivae and EOM are normal. Pupils are equal, round, and reactive to light. Right eye exhibits no discharge. Left eye exhibits no discharge. No scleral icterus.  Neck: Normal range of motion. Neck supple.  No JVD present. No thyromegaly present.  Cardiovascular: Normal rate, regular rhythm, normal heart sounds and intact distal pulses.  Exam reveals no gallop and no friction rub.   No murmur heard. Pulmonary/Chest: Effort normal and breath sounds normal. No respiratory distress. She has no wheezes. She has no rales.  Abdominal: Soft. Bowel sounds are normal. She exhibits no distension and no mass. There is no tenderness.  Musculoskeletal: Normal range of motion. She exhibits no edema and no tenderness.  Lymphadenopathy:    She has no cervical adenopathy.  Neurological: She is alert. Coordination normal.  Skin: Skin is warm and dry. No rash noted. No erythema.  Psychiatric: She has a  normal mood and affect. Her behavior is normal.    ED Course  Procedures (including critical care time)   Labs Reviewed  GLUCOSE, CAPILLARY  POCT I-STAT TROPONIN I   No results found.   1. Preeclampsia      MDM  There is no right upper quadrant tenderness, she has a soft abdomen, clear lungs and heart sounds and a blood pressure which is currently 153/90. I have discussed her care with the cardiologist on-call Dr. Gala Romney who agrees with treating with IV hydralazine followed by Norvasc once daily at home. This was communicated to the patient and will followup as indicated. The patient appears stable for discharge  Pt has normal VS at this time with BP of 125/80.  She feels at baseline and amenable to f/u with OBGYN.    ED ECG REPORT  I personally interpreted this EKG   Date: 07/04/12 - 10 PM  Rate: 59  Rhythm: normal sinus rhythm  QRS Axis: normal  Intervals: normal  ST/T Wave abnormalities: normal  Conduction Disutrbances:none  Narrative Interpretation:   Old EKG Reviewed: none available   Discharge Prescriptions include:  norvasc      Vida Roller, MD 07/05/12 [phone]

## 2012-07-06 ENCOUNTER — Telehealth: Payer: Self-pay | Admitting: Obstetrics and Gynecology

## 2012-07-06 NOTE — Telephone Encounter (Addendum)
TC from [employer]. Pt's BP 118/78.  Took Norvasc 07/05/2012 at 2:30 PM.  Had bad headache last PM but none today.  Pulse 80. Reviewed with VL.  TC to pt.  To remain on same med.   To call if pain medication does not relieve andy headaches. Informed may be due to decrease in BP which may also cause pulse to increase (per VL.) To call if is not feeling well or with any concerns. To keep PP appt as scheduled. Pt verbalizes comprehension.

## 2012-07-20 ENCOUNTER — Other Ambulatory Visit: Payer: Self-pay | Admitting: Obstetrics and Gynecology

## 2012-07-24 ENCOUNTER — Encounter: Payer: Self-pay | Admitting: Obstetrics and Gynecology

## 2012-07-26 LAB — US OB FOLLOW UP

## 2012-07-30 ENCOUNTER — Ambulatory Visit (INDEPENDENT_AMBULATORY_CARE_PROVIDER_SITE_OTHER): Payer: Self-pay | Admitting: Obstetrics and Gynecology

## 2012-07-30 ENCOUNTER — Encounter: Payer: Self-pay | Admitting: Obstetrics and Gynecology

## 2012-07-30 DIAGNOSIS — Z124 Encounter for screening for malignant neoplasm of cervix: Secondary | ICD-10-CM

## 2012-07-30 NOTE — Progress Notes (Signed)
Michelle Curry  is 6 weeks postpartum following a spontaneous vaginal delivery at 28 gestational weeks Date: 06/13/12 female baby named Ladona Ridgel delivered by [employer].  Breastfeeding: no Bottlefeeding:  yes  Post-partum blues / depression:  no  EPDS score: 1  History of abnormal Pap:  no  Last Pap: Date  03/2011 Gestational diabetes:  no  Contraception:   bilateral tubal ligation  Normal urinary function:  yes Normal GI function:  yes Returning to work:  No  Subjective:     Michelle Curry is a 31 y.o. female who presents for a postpartum visit.  I have fully reviewed the prenatal and intrapartum course.    Patient is sexually active.   The following portions of the patient's history were reviewed and updated as appropriate: allergies, current medications, past family history, past medical history, past social history, past surgical history and problem list.  Review of Systems Pertinent items are noted in HPI.   Objective:    BP 122/80  Temp 98.2 F (36.8 C) (Oral)  Wt 165 lb (74.844 kg)  Breastfeeding? No  General:  alert, cooperative and no distress     Lungs: clear to auscultation bilaterally  Heart:  regular rate and rhythm, S1, S2 normal, no murmur  Abdomen: soft, non-tender; bowel sounds normal; no masses,  no organomegaly   Vulva:  normal  Vagina: normal vagina  Cervix:  normal  Corpus: normal size, contour, position, consistency, mobility, non-tender  Adnexa:  normal adnexa             Assessment:     Normal postpartum exam.  Pap smear done at today's visit.   Plan:    Follow-up 1 year   Richa Shor A MD 07/30/2012 12:08 PM

## 2012-07-31 LAB — PAP IG W/ RFLX HPV ASCU

## 2012-08-02 NOTE — H&P (Signed)
Michelle Curry is a 31 y.o.MB female presenting in active labor.  Reports regular ctxs since around 0700.  Accompanied by her mother and her husband.  Denies LOF or VB.  GFM.  Denies UTI or PIH s/s.  No recent illness or fever.  Desires epidural for labor, and PP BTL.  Cx at yesterday's office appt=3/50/-1.    Prenatal Course:  Pt entered care at [facility] for NOB w/u on 12/07/11 at 11w.  EDC by 1st trimester u/s.  Pt had normal 1st trimester screen, AFP, and anatomy screen.  GTT WNL=108.  FH measuring S>D around 28 weeks, w/ serial growth u/s thereafter c/w suspected LGA.  Pt had normal pregnancy discomforts, and overall uncomplicated pregnancy.    OB Hx:  G1=SVD '03 @39weeks ; Female=8+1 by Dr. Estanislado Pandy; son w/ arthrogryposis               G2=SVD '07 @37weeks ; Female=8+7; IOL for decreased FM; Dr. Estanislado Pandy               G3=SAB '11 @5  weeks               G4=SVD '11 @39  weeks; Female=9+9; Dr. Normand Sloop; Postpartum PreEclampsia               G5=current  Maternal Medical History:  Reason for admission: Reason for admission: contractions.  Contractions: Onset was 3-5 hours ago.   Frequency: regular.   Perceived severity is strong.    Fetal activity: Perceived fetal activity is normal.   Last perceived fetal movement was within the past hour.    Prenatal complications: 1.  H/o macrosomia; current pregnancy w/ suspected LGA 2.  H/o PP PreEclampsia w/ last delivery 3.  H/o heart murmur 4.  Son w/ arthrogryposis 5.  Abdominal hernia 6.  Migraines 7. H/o anemia     Past Medical History  Diagnosis Date  . GBS (group B streptococcus) infection 2007  . H/O varicella   . Hx of migraines   . Cystitis     x 2  . Anemia     as a teen   . Preeclampsia 06/20/2012  . Bradycardia 06/19/2012   Past Surgical History  Procedure Date  . Tubal ligation 06/14/2012    Procedure: POST PARTUM TUBAL LIGATION;  Surgeon: Michael Litter, MD;  Location: [facility];  Service: Gynecology;  Laterality: Bilateral;  . No past  surgeries    Family History: family history includes Migraines in her father.  There is no history of Other. Social History:  reports that she has never smoked. She has never used smokeless tobacco. She reports that she does not drink alcohol or use illicit drugs.   Prenatal Transfer Tool  Maternal Diabetes: No Genetic Screening: Normal Maternal Ultrasounds/Referrals: Normal Fetal Ultrasounds or other Referrals:  None Maternal Substance Abuse:  No Significant Maternal Medications:  None Significant Maternal Lab Results:  Lab values include: Group B Strep negative Other Comments:  LGA suspected; oldest son w/ arthrogryposis  Review of Systems  Constitutional: Negative.   Eyes: Negative.   Respiratory: Negative.   Cardiovascular: Negative.   Gastrointestinal: Negative.   Genitourinary: Negative.   Skin: Negative.   Neurological: Positive for headaches.   Initial BP's in MAU:  127/77; 129/68;   Blood pressure 111/79, pulse 80, temperature 98.7 F (37.1 C), temperature source Oral, resp. rate 18, height 5\' 3"  (1.6 m), weight 195 lb (88.451 kg), last menstrual period 09/18/2011, SpO2 98.00%, not currently breastfeeding. Maternal Exam:  Uterine Assessment: Contraction strength  is moderate.  Contraction frequency is regular.  UC's q 2-4  Abdomen: Patient reports no abdominal tenderness. Estimated fetal weight is 8-9 lbs.   Fetal presentation: vertex  Introitus: Normal vulva. Pelvis: adequate for delivery.   Cervix: Cervix evaluated by digital exam.     Fetal Exam Fetal Monitor Review: Mode: ultrasound.   Baseline rate: 130.  Variability: moderate (6-25 bpm).   Pattern: accelerations present and no decelerations.    Fetal State Assessment: Category I - tracings are normal.     Physical Exam  Constitutional: She is oriented to person, place, and time. She appears well-developed and well-nourished. She appears distressed.       Labored breathing and notable discomfort w/  ctxs; anxious r/e ability to get epidural secondary to advanced dilatation  HENT:  Head: Normocephalic and atraumatic.  Eyes: Pupils are equal, round, and reactive to light.  Cardiovascular: Normal rate and regular rhythm.   Respiratory: Effort normal and breath sounds normal.  GI: Soft.       gravid  Genitourinary:       Cx=7.5/90/0 on admission  Musculoskeletal: She exhibits edema.       Mild generalized, non-pitting edema  Neurological: She is alert and oriented to person, place, and time. She has normal reflexes.  Skin: Skin is warm and dry.  Psychiatric: She has a normal mood and affect. Her behavior is normal. Judgment and thought content normal.    Prenatal labs: ABO, Rh: A/Positive/-- (01/02 0000) Antibody: Negative (01/02 0000) Rubella: Immune (01/02 0000) RPR: NON REACTIVE (07/10 0855)  HBsAg: Negative (01/02 0000)  HIV: Non-reactive (01/02 0000)  GBS: NEGATIVE (06/18 1508)  1hr gtt=108 1st trimester screen and AFP:  Negative  Assessment/Plan: 1.  [redacted]w[redacted]d 2.  Active labor 3.  Desires epidural and PP BTL 4.  Cat I FHT 5.  GBS neg 6.  LGA suspected  1.  Admit to BS w/ dr. Su Hilt as attending 2.  Routine L&D orders 3.  Epidural ASAP 4.  Support as needed 5.  AROM prn augmentation 6.  Anticipate SVD 7.  C/w MD prn  Velia Pamer H 08/02/2012, 8:19 AM

## 2012-08-15 ENCOUNTER — Telehealth: Payer: Self-pay | Admitting: Obstetrics and Gynecology

## 2012-08-15 NOTE — Telephone Encounter (Signed)
[facility]

## 2012-08-15 NOTE — Telephone Encounter (Signed)
9 weeks pp and concerned about just spotting with no true cycle yet.  Notified pt that she may not have a real cycle for a few months.  Suggested observing for now.  Pt agreeable.  ld

## 2012-09-11 ENCOUNTER — Other Ambulatory Visit (INDEPENDENT_AMBULATORY_CARE_PROVIDER_SITE_OTHER): Payer: Medicaid Other

## 2012-09-11 ENCOUNTER — Other Ambulatory Visit: Payer: Medicaid Other

## 2012-09-11 ENCOUNTER — Telehealth: Payer: Self-pay

## 2012-09-11 ENCOUNTER — Other Ambulatory Visit: Payer: Self-pay

## 2012-09-11 ENCOUNTER — Telehealth: Payer: Self-pay | Admitting: Obstetrics and Gynecology

## 2012-09-11 DIAGNOSIS — N390 Urinary tract infection, site not specified: Secondary | ICD-10-CM

## 2012-09-11 LAB — POCT URINALYSIS DIPSTICK
Nitrite, UA: NEGATIVE
pH, UA: 7

## 2012-09-11 MED ORDER — NITROFURANTOIN MONOHYD MACRO 100 MG PO CAPS: 100.0000 mg | ORAL_CAPSULE | Freq: Two times a day (BID) | ORAL | Status: AC

## 2012-09-11 NOTE — Telephone Encounter (Signed)
TC TO PT CONCERNING MESSAGE. PT STATES SHE IS HAVING UTI SX AND WAS INFORMED BY SR TO CALL [facility] IF PT HAVE ANY PROBLEMS. PT STATES IF SHE CAN HAVE RX FOR UTI SHE WOULD APPRECIATE IT;PT DO NOT HAVE A BABY SITTER TO COME TO THE OFFICE BUTIF SHE HAVE TO COME SHE WILL.PT STATES SHE IS VERY UNCOMFORTABLE.

## 2012-09-11 NOTE — Telephone Encounter (Signed)
Coming in to leave urine for dip. ld

## 2012-09-19 ENCOUNTER — Emergency Department (INDEPENDENT_AMBULATORY_CARE_PROVIDER_SITE_OTHER)
Admission: EM | Admit: 2012-09-19 | Discharge: 2012-09-19 | Disposition: A | Discharge status: Home / Self Care | Payer: Medicaid Other | Source: Home / Self Care

## 2012-09-19 ENCOUNTER — Encounter (HOSPITAL_COMMUNITY): Payer: Self-pay

## 2012-09-19 DIAGNOSIS — I10 Essential (primary) hypertension: Secondary | ICD-10-CM

## 2012-09-19 DIAGNOSIS — L270 Generalized skin eruption due to drugs and medicaments taken internally: Secondary | ICD-10-CM

## 2012-09-19 LAB — CBC WITH DIFFERENTIAL/PLATELET
Basophils Absolute: 0 10*3/uL (ref 0.0–0.1)
Eosinophils Absolute: 0.2 10*3/uL (ref 0.0–0.7)
Lymphocytes Relative: 19 % (ref 12–46)
MCHC: 31.3 g/dL (ref 30.0–36.0)
Monocytes Relative: 5 % (ref 3–12)
Neutrophils Relative %: 74 % (ref 43–77)
Platelets: 280 10*3/uL (ref 150–400)
RDW: 13.1 % (ref 11.5–15.5)
WBC: 8.2 10*3/uL (ref 4.0–10.5)

## 2012-09-19 LAB — POCT URINALYSIS DIP (DEVICE)
Bilirubin Urine: NEGATIVE
Ketones, ur: NEGATIVE mg/dL
Leukocytes, UA: NEGATIVE
Nitrite: NEGATIVE

## 2012-09-19 NOTE — ED Provider Notes (Signed)
Info Source:  PT  CC:  Rash, extreme fatigue, rib pain  HPI:  Pt is 31 y.o. G5P4014 at 3 months postpartum with sudden onset rash today starting on legs, now on arms too, very itchy, red bumps.  Also severe fatigue and bilateral rib pin for about a week. She was diagnosed with UTI last week and started on Nitrofurantoin, which she finished yesterday. Her suprapubic pain has resolved but now she has pain in bilateral rib cage on sides. Pain worse with movement, lifting. Mild chest "heaviness" but no SOB or wheezing (felt same postpartum when had preeclampsia).  Headache that started about a week ago with UTI. Hx postpartum preeclampsia treated with HCTZ, which was stopped at 6 week PP visit. BP has been normal at home - checked last week when HA. Started.  Puffy eyes last few days. LMP:  She had very heavy period (first since delivery in July) for 2 days over weekend - using tampon AND pad. Now not bleeding.   Past Medical History  Diagnosis Date  . GBS (group B streptococcus) infection 2007  . H/O varicella   . Hx of migraines   . Cystitis     x 2  . Anemia     as a teen   . Preeclampsia 06/20/2012  . Bradycardia 06/19/2012   Past Surgical History  Procedure Date  . Tubal ligation 06/14/2012    Procedure: POST PARTUM TUBAL LIGATION;  Surgeon: Michael Litter, MD;  Location: [facility];  Service: Gynecology;  Laterality: Bilateral;  . No past surgeries    Social:  Non-smoker.  No current facility-administered medications on file prior to encounter.   Current Outpatient Prescriptions on File Prior to Encounter  Medication Sig Dispense Refill  . acetaminophen (TYLENOL) 325 MG tablet Take 325 mg by mouth every 6 (six) hours as needed. Takes for pain      . IRON PO Take 2 tablets by mouth at bedtime.      . Pyridoxine HCl (B-6 PO) Take 1 tablet by mouth at bedtime.      Marland Kitchen amLODipine (NORVASC) 10 MG tablet Take 1 tablet (10 mg total) by mouth daily.  30 tablet  0  . nitrofurantoin,  macrocrystal-monohydrate, (MACROBID) 100 MG capsule Take 1 capsule (100 mg total) by mouth 2 (two) times daily.  14 capsule  0   Allergies:  Latex, bactrim (not sure of reaction - flu-like, headaches)  PHYSCIAL EXAM Filed Vitals:   09/19/12 1812 09/19/12 2010  BP: 159/93 172/96  Pulse: 63   Temp: 98.8 F (37.1 C)   Resp: 16   SpO2: 100%    GEN:  WNWD, no distress HEENT:  PERRL, EOMI, NCAT, no swelling of eyelids/eyes; OP clear NECK:  Supple, no thyromegaly or lymphadenopathy CV:  RRR no murmur RESP:  CTAB Abd:  Soft, NT, ND, normal bowel sounds. No CVA tenderness. Mild tenderness with palpation of lower lateral rib cage bilaterally EXTREM:  No edema NEURO:  Alert and oriented, no focal deficits  Results for orders placed during the hospital encounter of 09/19/12 (from the past 24 hour(s))  CBC WITH DIFFERENTIAL     Status: Abnormal   Collection Time   09/19/12  6:32 PM      Component Value Range   WBC 8.2  4.0 - 10.5 K/uL   RBC 5.20 (*) 3.87 - 5.11 MIL/uL   Hemoglobin 11.9 (*) 12.0 - 15.0 g/dL   HCT 16.1  09.6 - 04.5 %   MCV 73.1 (*)  78.0 - 100.0 fL   MCH 22.9 (*) 26.0 - 34.0 pg   MCHC 31.3  30.0 - 36.0 g/dL   RDW 78.2  95.6 - 21.3 %   Platelets 280  150 - 400 K/uL   Neutrophils Relative 74  43 - 77 %   Lymphocytes Relative 19  12 - 46 %   Monocytes Relative 5  3 - 12 %   Eosinophils Relative 2  0 - 5 %   Basophils Relative 0  0 - 1 %   Neutro Abs 6.0  1.7 - 7.7 K/uL   Lymphs Abs 1.6  0.7 - 4.0 K/uL   Monocytes Absolute 0.4  0.1 - 1.0 K/uL   Eosinophils Absolute 0.2  0.0 - 0.7 K/uL   Basophils Absolute 0.0  0.0 - 0.1 K/uL   Smear Review LARGE PLATELETS PRESENT    POCT URINALYSIS DIP (DEVICE)     Status: Normal   Collection Time   09/19/12  6:44 PM      Component Value Range   Glucose, UA NEGATIVE  NEGATIVE mg/dL   Bilirubin Urine NEGATIVE  NEGATIVE   Ketones, ur NEGATIVE  NEGATIVE mg/dL   Specific Gravity, Urine <=1.005  1.005 - 1.030   Hgb urine dipstick  NEGATIVE  NEGATIVE   pH 7.0  5.0 - 8.0   Protein, ur NEGATIVE  NEGATIVE mg/dL   Urobilinogen, UA 0.2  0.0 - 1.0 mg/dL   Nitrite NEGATIVE  NEGATIVE   Leukocytes, UA NEGATIVE  NEGATIVE    A/P 31 y.o. African-American female with 1.  Rash - likely reaction to Nitrofurantoin. Drug stopped. Benadryl for itching. 2.  Rib pain - musculoskeletal - naproxen/tylenol, heat/ice 3.  Fatigue:  Mild anemia, no white count. May be due to medication, UTI, recent delivery with 2 small children/lack of sleep. Needs further work-up by PCP. Did have large platelets - may be getting over viral illness 4.  High blood pressure - pt has been checking at home and has been normal.  Needs to follow up with PCP. Call PCP tomorrow and monitor BP at home. 5.  Headache - may be due to drug reaction vs high BP or viral illness. Tylenol or ibuprofen/naproxen Stable for discharge home.  Napoleon Form, MD    Napoleon Form, MD 09/19/12 [phone]

## 2012-09-19 NOTE — ED Notes (Signed)
Concern for sudden rash on both legs some on arm, none on trunk; denies dysphagia, ST, SOB; on nitrofuratonin for UTI

## 2012-09-20 ENCOUNTER — Encounter: Payer: Self-pay | Admitting: Obstetrics and Gynecology

## 2012-09-20 ENCOUNTER — Telehealth: Payer: Self-pay | Admitting: Obstetrics and Gynecology

## 2012-09-20 ENCOUNTER — Ambulatory Visit (INDEPENDENT_AMBULATORY_CARE_PROVIDER_SITE_OTHER): Payer: Medicaid Other | Admitting: Obstetrics and Gynecology

## 2012-09-20 VITALS — BP 106/76 | HR 74 | Wt 155.0 lb

## 2012-09-20 DIAGNOSIS — Z888 Allergy status to other drugs, medicaments and biological substances status: Secondary | ICD-10-CM

## 2012-09-20 DIAGNOSIS — T7840XA Allergy, unspecified, initial encounter: Secondary | ICD-10-CM

## 2012-09-20 DIAGNOSIS — N390 Urinary tract infection, site not specified: Secondary | ICD-10-CM

## 2012-09-20 MED ORDER — HYDROXYZINE HCL 10 MG PO TABS: 10.0000 mg | ORAL_TABLET | Freq: Three times a day (TID) | ORAL | Status: DC | PRN

## 2012-09-20 NOTE — Progress Notes (Signed)
Subjective:    Michelle Curry is a 31 y.o. female, G2X5284, who presents for allergic reaction to nitrofurantoin with a rash all over body and pain in rib cadge . No swelling and no difficulty breathing.Completed treatment for UTI. Urine culture was lost.   The following portions of the patient's history were reviewed and updated as appropriate: allergies, current medications, past family history.  Review of Systems Pertinent items are noted in HPI.   Objective:    BP 106/76  Pulse 74  Wt 155 lb (70.308 kg)  LMP 09/14/2012  Breastfeeding? No    Weight:  Wt Readings from Last 1 Encounters:  09/20/12 155 lb (70.308 kg)          BMI: There is no height on file to calculate BMI.  General Appearance: Alert, appropriate appearance for age. No acute distress. Small reticular rash on face, legs and back Abdomen: normal   Assessment:    Allergy to Cipro    Plan:    Caladryl lotion and Aveeno bath soak for relief. Atarax PRN  Silverio Lay MD

## 2012-09-20 NOTE — Telephone Encounter (Signed)
Spoke with pt rgd msg pt states had allergic reaction to abx given for uti pt has rash and itchy no relief with benadryl pt has appt with SR 09/04/12 at 1:40 pt voice understanding

## 2012-09-23 LAB — URINE CULTURE

## 2012-09-24 NOTE — Progress Notes (Signed)
Quick Note:  Pt completed Cipro treatment and developed rash. Currently asymptomatic. Recommend observation because count is <100,000. If symptoms are not completely resolved, I would give a course of Macrobid 100 mg BID for 7 days. ______

## 2012-09-25 ENCOUNTER — Telehealth: Payer: Self-pay

## 2012-09-25 NOTE — Telephone Encounter (Signed)
Message copied by Larwance Rote on Tue Sep 25, 2012 11:38 AM ------      Message from: Silverio Lay      Created: Mon Sep 24, 2012  1:51 PM       Pt completed Cipro treatment and developed rash. Currently asymptomatic. Recommend observation because count is <100,000. If symptoms are not completely resolved, I would give a course of Macrobid 100 mg BID for 7 days.

## 2012-12-21 ENCOUNTER — Encounter (HOSPITAL_COMMUNITY): Payer: Self-pay | Admitting: Hematology

## 2013-08-28 ENCOUNTER — Encounter (HOSPITAL_COMMUNITY): Payer: Self-pay | Admitting: Physical Medicine and Rehabilitation

## 2013-08-28 ENCOUNTER — Emergency Department (HOSPITAL_COMMUNITY)
Admission: EM | Admit: 2013-08-28 | Discharge: 2013-08-28 | Disposition: A | Discharge status: Home / Self Care | Payer: BC Managed Care – PPO | Attending: Emergency Medicine | Admitting: Emergency Medicine

## 2013-08-28 DIAGNOSIS — Z9889 Other specified postprocedural states: Secondary | ICD-10-CM | POA: Insufficient documentation

## 2013-08-28 DIAGNOSIS — R05 Cough: Secondary | ICD-10-CM | POA: Insufficient documentation

## 2013-08-28 DIAGNOSIS — Z87448 Personal history of other diseases of urinary system: Secondary | ICD-10-CM | POA: Insufficient documentation

## 2013-08-28 DIAGNOSIS — H9209 Otalgia, unspecified ear: Secondary | ICD-10-CM | POA: Insufficient documentation

## 2013-08-28 DIAGNOSIS — Z862 Personal history of diseases of the blood and blood-forming organs and certain disorders involving the immune mechanism: Secondary | ICD-10-CM | POA: Insufficient documentation

## 2013-08-28 DIAGNOSIS — Z8619 Personal history of other infectious and parasitic diseases: Secondary | ICD-10-CM | POA: Insufficient documentation

## 2013-08-28 DIAGNOSIS — R51 Headache: Secondary | ICD-10-CM | POA: Insufficient documentation

## 2013-08-28 DIAGNOSIS — M542 Cervicalgia: Secondary | ICD-10-CM | POA: Insufficient documentation

## 2013-08-28 DIAGNOSIS — R059 Cough, unspecified: Secondary | ICD-10-CM | POA: Insufficient documentation

## 2013-08-28 DIAGNOSIS — Z91040 Latex allergy status: Secondary | ICD-10-CM | POA: Insufficient documentation

## 2013-08-28 DIAGNOSIS — R490 Dysphonia: Secondary | ICD-10-CM | POA: Insufficient documentation

## 2013-08-28 DIAGNOSIS — Z8679 Personal history of other diseases of the circulatory system: Secondary | ICD-10-CM | POA: Insufficient documentation

## 2013-08-28 MED ORDER — DIAZEPAM 5 MG PO TABS: 5.0000 mg | ORAL_TABLET | Freq: Four times a day (QID) | ORAL | Status: DC | PRN

## 2013-08-28 NOTE — ED Notes (Signed)
Patient is alert and orientedx4.  Patient was explained discharge instructions and they understood them with no questions.   

## 2013-08-28 NOTE — ED Notes (Signed)
Pt presents to department for evaluation of neck pain. Onset x3 weeks ago while singing at church and screaming during football game. Also states hoarse voice. 5/10 soreness to neck at the time. Denies fever. Pt is conscious alert and oriented x4.

## 2013-08-28 NOTE — ED Notes (Signed)
Patient said she was singing at church two sundays ago and a "sharp" pain that started in the back of her neck and went al lthe way up to the back of her head.  She said it went away and then at one of her son's sporting events she was cheering and screaming and it happened again.  This time it has not gone away.  She says it is sharp when she tries to scream, but if she just sits it feels like a "crick in her neck".

## 2013-08-28 NOTE — ED Provider Notes (Signed)
CSN: [id number]     Arrival date & time 08/28/13  1424 History  This chart was scribed for non-physician practitioner Junious Silk, PA-C, working with Shelda Jakes, MD by Dorothey Baseman, ED Scribe. This patient was seen in room TR07C/TR07C and the patient's care was started at 3:18 PM.    Chief Complaint  Patient presents with  . Neck Pain  . Hoarse   The history is provided by the patient. No language interpreter was used.   HPI Comments: Michelle Curry is a 32 y.o. female who presents to the Emergency Department complaining of a pain that shoots up the back of the neck, onset 3 weeks ago while she was singing at church that lasted approximately 15 seconds with a similar occurrence 2 weeks ago and again last night. She reports the pain is exacerbated with yelling and coughing. She reports taking one dose of ibuprofen last week with mild, temporary relief. Patient reports an associated mild, diffuse headache and ear pain. She denies fever. Patient has a history of gland removal surgery.   Past Medical History  Diagnosis Date  . GBS (group B streptococcus) infection 2007  . H/O varicella   . Hx of migraines   . Cystitis     x 2  . Anemia     as a teen   . Preeclampsia 06/20/2012  . Bradycardia 06/19/2012  . Preeclampsia complicating hypertension    Past Surgical History  Procedure Laterality Date  . Tubal ligation  06/14/2012    Procedure: POST PARTUM TUBAL LIGATION;  Surgeon: Michael Litter, MD;  Location: [facility];  Service: Gynecology;  Laterality: Bilateral;  . No past surgeries     Family History  Problem Relation Age of Onset  . Other Neg Hx   . Migraines Father    History  Substance Use Topics  . Smoking status: Never Smoker   . Smokeless tobacco: Never Used  . Alcohol Use: No   OB History   Grav Para Term Preterm Abortions TAB SAB Ect Mult Living   5 4 4  1  1   4      Review of Systems  Constitutional: Negative for fever.  HENT: Positive for ear pain and neck  pain.   Neurological: Positive for headaches.  All other systems reviewed and are negative.    Allergies  Bactrim; Latex; and Nitrofuran derivatives  Home Medications   Current Outpatient Rx  Name  Route  Sig  Dispense  Refill  . IRON PO   Oral   Take 2 tablets by mouth at bedtime.         . Multiple Vitamin (MULTIVITAMIN WITH MINERALS) TABS tablet   Oral   Take 1 tablet by mouth daily.          Triage Vitals: BP 147/98  Pulse 83  Temp(Src) 98.5 F (36.9 C) (Oral)  Resp 18  Ht 5\' 3"  (1.6 m)  Wt 139 lb (63.05 kg)  BMI 24.63 kg/m2  SpO2 100%  Physical Exam  Nursing note and vitals reviewed. Constitutional: She is oriented to person, place, and time. She appears well-developed and well-nourished.  Non-toxic appearance. She does not have a sickly appearance. She does not appear ill. No distress.  HENT:  Head: Normocephalic and atraumatic.  Right Ear: Tympanic membrane, external ear and ear canal normal.  Left Ear: Tympanic membrane, external ear and ear canal normal.  Nose: Nose normal.  Mouth/Throat: Uvula is midline, oropharynx is clear and moist and mucous  membranes are normal. No oral lesions. No trismus in the jaw. No dental abscesses, edematous or lacerations. No oropharyngeal exudate, posterior oropharyngeal edema, posterior oropharyngeal erythema or tonsillar abscesses.  TMs normal bilaterally.  Eyes: Conjunctivae, EOM and lids are normal. Pupils are equal, round, and reactive to light.  Neck: Trachea normal, normal range of motion, full passive range of motion without pain and phonation normal. Neck supple. No tracheal tenderness, no spinous process tenderness and no muscular tenderness present. No rigidity. Normal range of motion present.  No tenderness to palpation over neck. No nuchal rigidity or meningeal signs.  Cardiovascular: Normal rate, regular rhythm, normal heart sounds, intact distal pulses and normal pulses.   Pulmonary/Chest: Effort normal and  breath sounds normal. No stridor. No respiratory distress. She has no wheezes. She has no rales.  Abdominal: Soft. She exhibits no distension.  Musculoskeletal: Normal range of motion.  5+/5+ grip strength.  Lymphadenopathy:    She has no cervical adenopathy.       Right: No supraclavicular adenopathy present.       Left: No supraclavicular adenopathy present.  Neurological: She is alert and oriented to person, place, and time. She has normal strength. No sensory deficit. Coordination and gait normal.  Finger nose finger normal.   Skin: Skin is warm and dry. She is not diaphoretic. No erythema.  Psychiatric: She has a normal mood and affect. Her behavior is normal.    ED Course  Procedures (including critical care time)  DIAGNOSTIC STUDIES: Oxygen Saturation is 100% on room air, normal by my interpretation.    COORDINATION OF CARE: 3:24PM- Discussed that symptoms are likely musculoskeletal in nature, possibly due to a strained muscle. Will discharge patient with Valium to manage pain symptoms. Advised patient to follow up with her PCP if there are any new or worsening symptoms. Discussed treatment plan with patient at bedside and patient verbalized agreement.     Labs Review Labs Reviewed - No data to display Imaging Review No results found.  MDM   1. Neck pain    Patient is a very well appearing, otherwise healthy 32 year old female who presents today with intermittent neck pain for the past 3 weeks exacerbated with yelling, singing, and coughing. No nuchal rigidity or meningeal signs. Afebrile. No adenopathy. No trismus, change in voice. No red flags. Will give Valium for likely msk etiology. I gave strict return instructions. Patient will follow up with PCP. Vital signs stable for discharge. Patient / Family / Caregiver informed of clinical course, understand medical decision-making process, and agree with plan.   I personally performed the services described in this  documentation, which was scribed in my presence. The recorded information has been reviewed and is accurate.     Mora Bellman, PA-C 08/28/13 [phone]

## 2013-08-29 NOTE — ED Provider Notes (Signed)
Medical screening examination/treatment/procedure(s) were performed by non-physician practitioner and as supervising physician I was immediately available for consultation/collaboration.   Isak Sotomayor W. Enrique Manganaro, MD 08/29/13 1209 

## 2013-10-20 IMAGING — CT CT HEAD W/O CM
2 series · 16 of 30 positions shown, 20 images · non-contrast
Comparison: 08/29/2009

CLINICAL DATA: Headache, preeclampsia, bradycardia

CT HEAD WITHOUT CONTRAST
TECHNIQUE: Contiguous axial images were obtained from the base of
the skull through the vertex without contrast.

[Series 3: routine head w/o · axial · non-contrast · 0.43mm/px · z∈[+1199,+1323]mm · 13 of 32 slices shown, 17 images]
[im 3/32  brain]
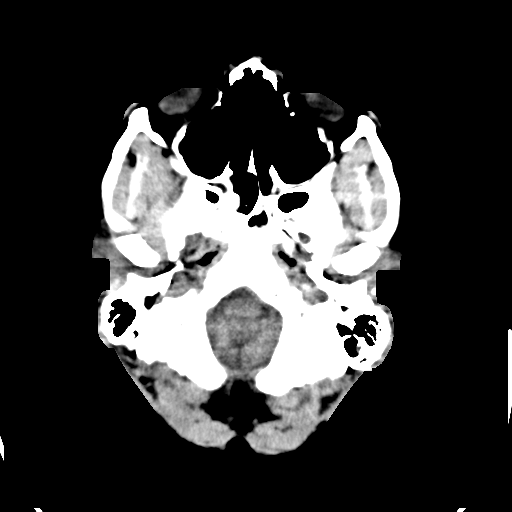
[im 3/32  bone]
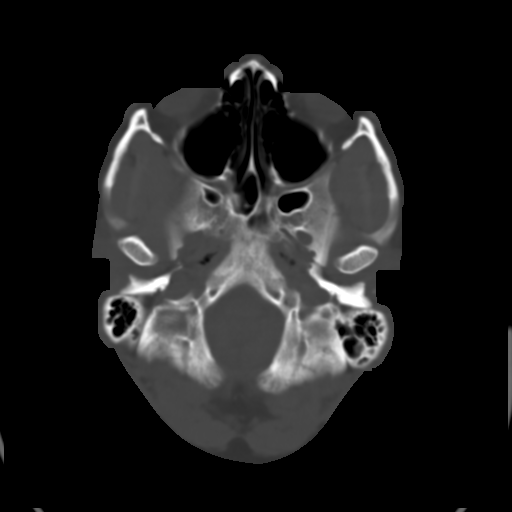
[im 5/32  brain]
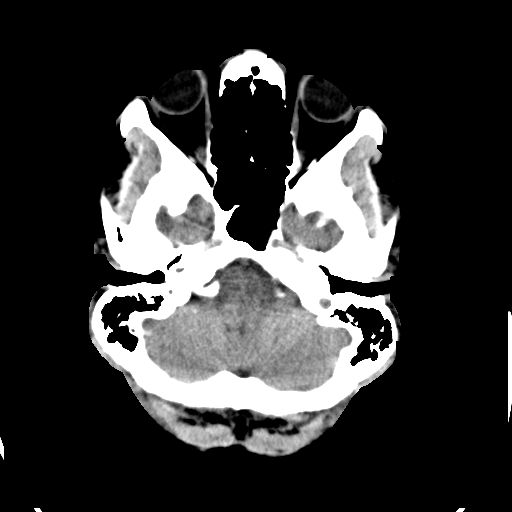
[im 7/32  brain]
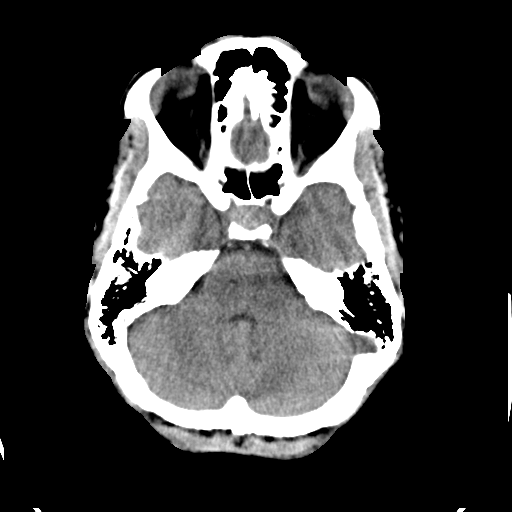
[im 9/32  brain]
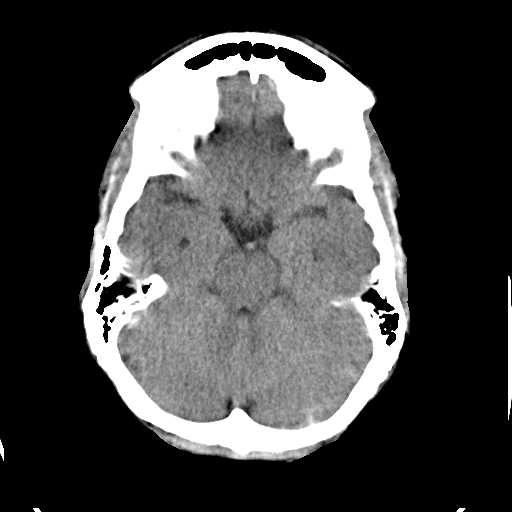
[im 12/32  brain]
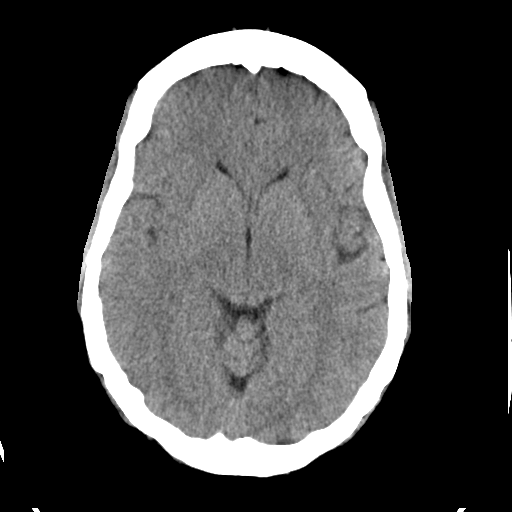
[im 12/32  bone]
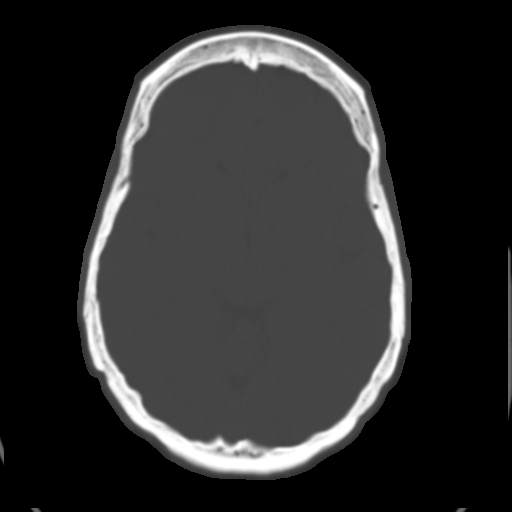
[im 14/32  brain]
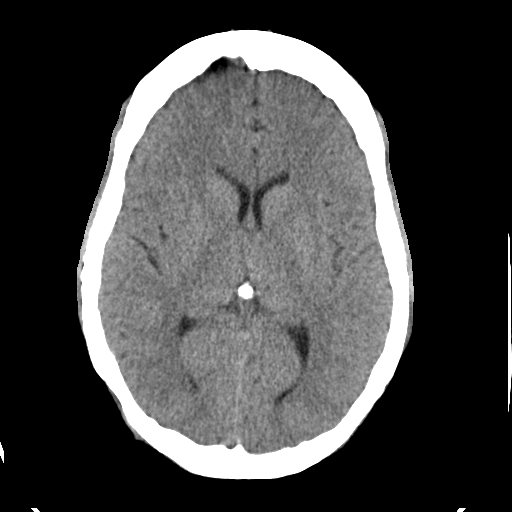
[im 16/32  brain]
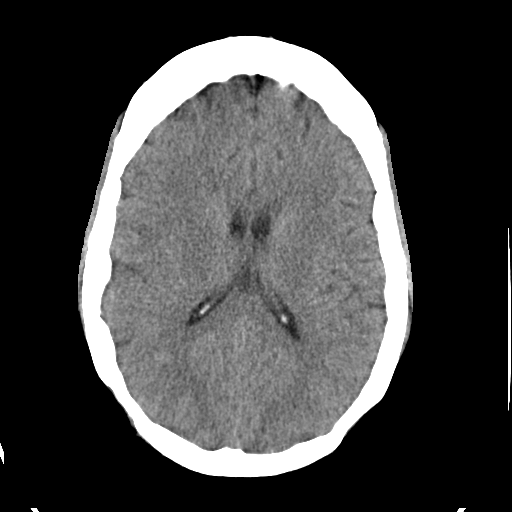
[im 18/32  brain]
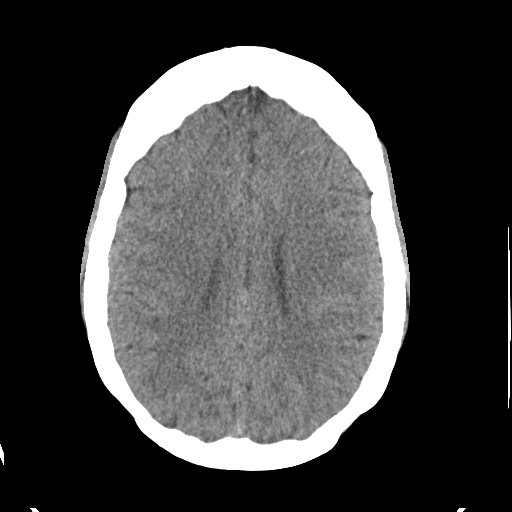
[im 20/32  brain]
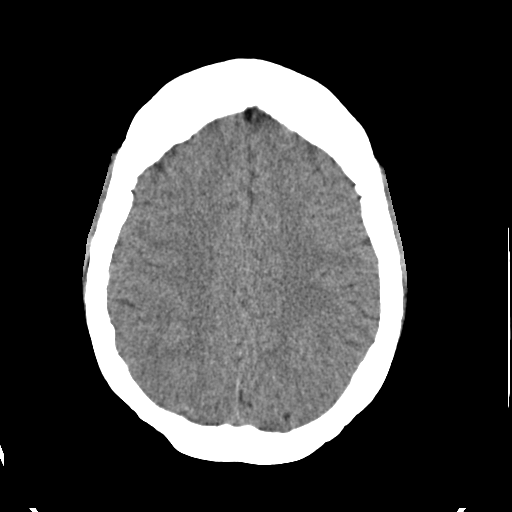
[im 20/32  bone]
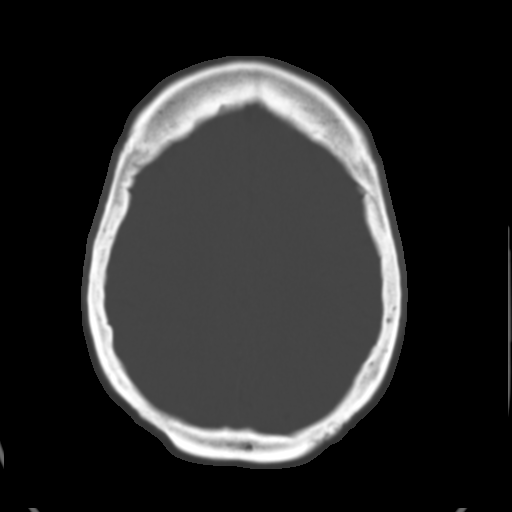
[im 23/32  brain]
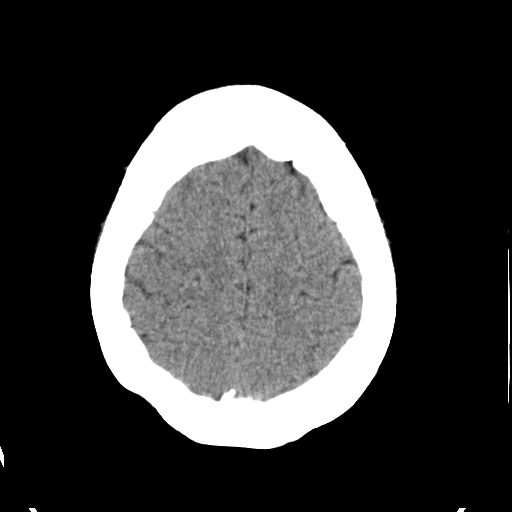
[im 25/32  brain]
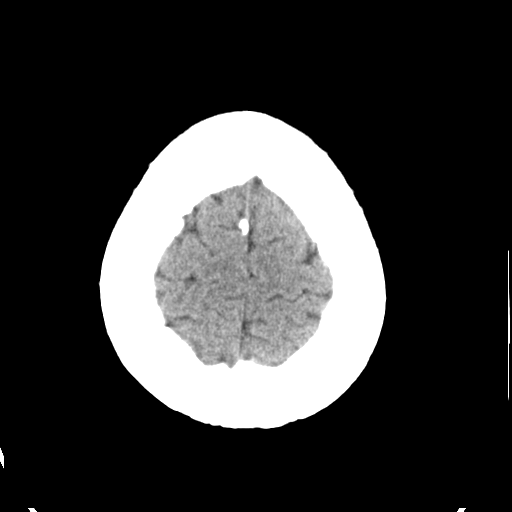
[im 27/32  brain]
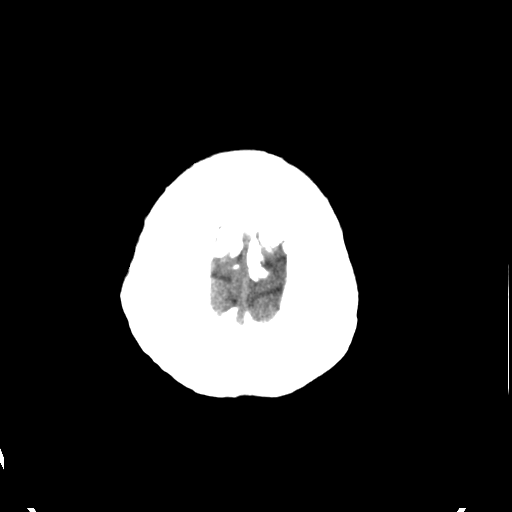
[im 29/32  brain]
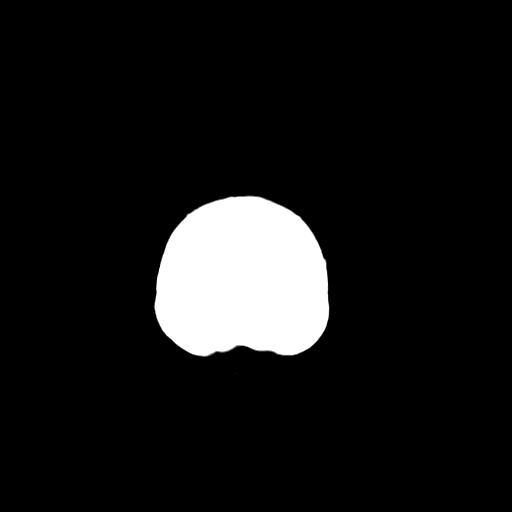
[im 29/32  bone]
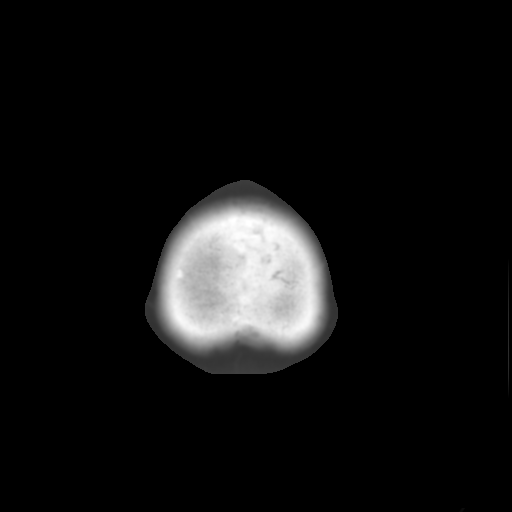

[Series 4: routine head bone · axial · 0.43mm/px · z∈[+1199,+1242]mm · 3 of 32 slices shown]
[im 3/32  bone]
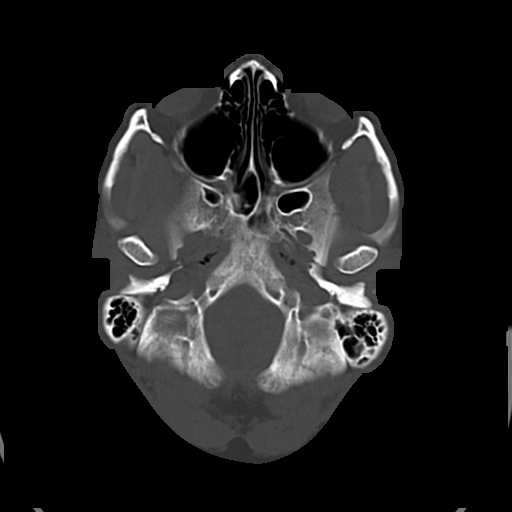
[im 7/32  bone]
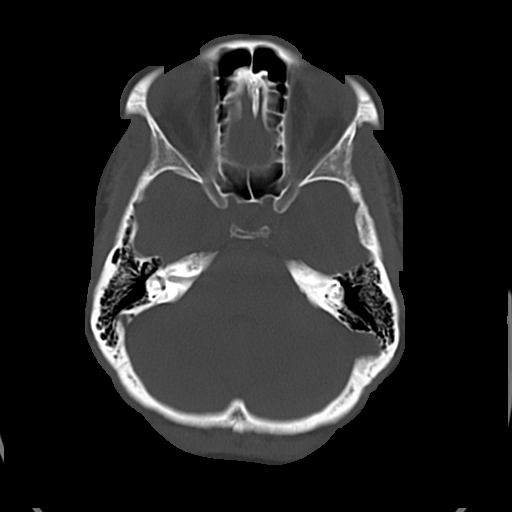
[im 12/32  bone]
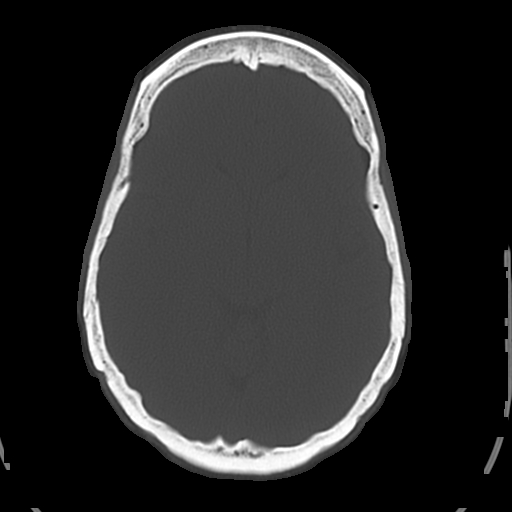

[16 of 30 positions shown; findings below may reference images not displayed]

FINDINGS: No acute hemorrhage, acute infarction, or mass lesion is
identified.  No midline shift.  No ventriculomegaly.  No skull
fracture.  No midline shift.  Orbits and paranasal sinuses are
intact.
IMPRESSION: No acute intracranial finding.

## 2014-04-11 ENCOUNTER — Encounter (HOSPITAL_COMMUNITY): Payer: Self-pay | Admitting: Emergency Medicine

## 2014-04-11 ENCOUNTER — Emergency Department (HOSPITAL_COMMUNITY)
Admission: EM | Admit: 2014-04-11 | Discharge: 2014-04-11 | Discharge status: Against Medical Advice | Payer: BC Managed Care – PPO | Attending: Emergency Medicine | Admitting: Emergency Medicine

## 2014-04-11 ENCOUNTER — Other Ambulatory Visit: Payer: Self-pay

## 2014-04-11 DIAGNOSIS — R259 Unspecified abnormal involuntary movements: Secondary | ICD-10-CM | POA: Insufficient documentation

## 2014-04-11 DIAGNOSIS — H538 Other visual disturbances: Secondary | ICD-10-CM | POA: Insufficient documentation

## 2014-04-11 DIAGNOSIS — R42 Dizziness and giddiness: Secondary | ICD-10-CM | POA: Insufficient documentation

## 2014-04-11 LAB — BASIC METABOLIC PANEL
BUN: 10 mg/dL (ref 6–23)
CALCIUM: 9.4 mg/dL (ref 8.4–10.5)
CO2: 25 meq/L (ref 19–32)
Chloride: 104 mEq/L (ref 96–112)
Creatinine, Ser: 0.71 mg/dL (ref 0.50–1.10)
GFR calc Af Amer: >90 mL/min (ref >90)
GFR calc non Af Amer: >90 mL/min (ref >90)
GLUCOSE: 84 mg/dL (ref 70–99)
Potassium: 3.9 mEq/L (ref 3.7–5.3)
SODIUM: 141 meq/L (ref 137–147)

## 2014-04-11 LAB — CBC
HCT: 36.2 % (ref 36.0–46.0)
HEMOGLOBIN: 11.7 g/dL — AB (ref 12.0–15.0)
MCH: 23.1 pg — AB (ref 26.0–34.0)
MCHC: 32.3 g/dL (ref 30.0–36.0)
MCV: 71.4 fL — ABNORMAL LOW (ref 78.0–100.0)
Platelets: 232 10*3/uL (ref 150–400)
RBC: 5.07 MIL/uL (ref 3.87–5.11)
RDW: 13.3 % (ref 11.5–15.5)
WBC: 8 10*3/uL (ref 4.0–10.5)

## 2014-04-11 LAB — I-STAT TROPONIN, ED: Troponin i, poc: 0 ng/mL (ref 0.00–0.08)

## 2014-04-11 NOTE — ED Notes (Addendum)
No neuro deficits in triage

## 2014-04-11 NOTE — ED Notes (Signed)
Patient informed registration that she was not going to wait.

## 2014-04-11 NOTE — ED Notes (Signed)
No answer

## 2014-04-11 NOTE — ED Notes (Signed)
Pt presents with intermittent shooting pain into all her limbs 10 days ago, then moved to her back/pelvic region and fine hand tremors, dizziness, lightheaded, and blurred vision x3 days. Pt states her vision makes her feel "cross eyed."

## 2014-04-15 ENCOUNTER — Encounter: Payer: Self-pay | Admitting: Neurology

## 2014-04-15 ENCOUNTER — Ambulatory Visit (INDEPENDENT_AMBULATORY_CARE_PROVIDER_SITE_OTHER): Payer: BC Managed Care – PPO | Admitting: Neurology

## 2014-04-15 VITALS — BP 108/60 | HR 74 | Ht 64.57 in | Wt 143.4 lb

## 2014-04-15 DIAGNOSIS — M549 Dorsalgia, unspecified: Secondary | ICD-10-CM

## 2014-04-15 DIAGNOSIS — R209 Unspecified disturbances of skin sensation: Secondary | ICD-10-CM

## 2014-04-15 DIAGNOSIS — R200 Anesthesia of skin: Secondary | ICD-10-CM | POA: Insufficient documentation

## 2014-04-15 NOTE — Progress Notes (Addendum)
NEUROLOGY CONSULTATION NOTE  Michelle Curry MRN: [id number] DOB: [birth year]  Referring provider: Dr. Kevan Ny Primary care provider: Dr. Kevan Ny  Reason for consult:  Shooting pains throughout the body, back pain, tremors, numbness  Dear Dr Marlou Sa:  Thank you for your kind referral of Michelle Curry for consultation of the above symptoms. Although her history is well known to you, please allow me to reiterate it for the purpose of our medical record. The patient was accompanied to the clinic by her mother who also provides collateral information. Records and images were personally reviewed where available.  HISTORY OF PRESENT ILLNESS: This is a very pleasant 33 year old right-handed woman with a history of preeclampsia with 2 prior pregnancies, otherwise healthy, in her usual state of health until 3-1/2 weeks ago when she started having random very brief shooting pains lasting less than a second throughout her body, affecting her head, neck, cheeks, back, arms, and legs.  Symptoms subsided after 4 days, then she started having severe lower back and pelvic pain, described as "like in early labor."  She saw her OB who did an ultrasound which was unremarkable.  The pain subsided after a day or so.  She then started having mild tremors in both thumbs, more noticeable if she would do precise movements such as putting eyeliner or moving her computer mouse.  The symptoms caused her some anxiety.  This lasted 2-3 days then resolved.  On 05/08, she was in a store then felt very lightheaded "like floating," photosensitive, feeling that she would pass out.  She went to the ER, CBC and BMP were unremarkable, but she left before being evaluated due to long wait.  She went to bed and felt better the next day.  Last night she felt like her lips were tingling and her mouth was numb for an hour.  She could still move her mouth but it felt numb.  Today she has intermittent tingling and numbness on the tip of her  left thumb.  She started having the lower back pain "like in early labor" this morning, and took Tylenol and a very hot shower that helped.    She went to the ER in September 2014 when she started having shooting pain from the back of her head up her neck when doing certain movements such as hitting a high note in church or cheering for her son.  This resolved with a muscle relaxant. She has been feeling very fatigued for the past 2 years, attributed to having 4 young children.  She had migraines in the past, but none recently.  She denies any diplopia but feels that her vision is blurred.  No loss of vision.  She denies any dysarthria/dysphagia, bowel/bladder dysfunction, perineal numbness.  No recent falls.  She has 4 children and carries the 34 year old and 33 year old.  There is no family history of similar symptoms.o  PAST MEDICAL HISTORY: Past Medical History  Diagnosis Date  . GBS (group B streptococcus) infection 2007  . H/O varicella   . Hx of migraines   . Cystitis     x 2  . Anemia     as a teen   . Preeclampsia 06/20/2012  . Bradycardia 06/19/2012  . Preeclampsia complicating hypertension     PAST SURGICAL HISTORY: Past Surgical History  Procedure Laterality Date  . Tubal ligation  06/14/2012    Procedure: POST PARTUM TUBAL LIGATION;  Surgeon: Betsy Coder, MD;  Location:  [facility];  Service: Gynecology;  Laterality: Bilateral;  . No past surgeries      MEDICATIONS: Current Outpatient Prescriptions on File Prior to Visit  Medication Sig Dispense Refill  . diazepam (VALIUM) 5 MG tablet Take 1 tablet (5 mg total) by mouth every 6 (six) hours as needed for anxiety (spasms).  10 tablet  0  . IRON PO Take 2 tablets by mouth at bedtime.      . Multiple Vitamin (MULTIVITAMIN WITH MINERALS) TABS tablet Take 1 tablet by mouth daily.       No current facility-administered medications on file prior to visit.    ALLERGIES: Allergies  Allergen Reactions  . Bactrim  [Sulfamethoxazole-Trimethoprim] Other (See Comments)    Flu like symptoms  . Latex Other (See Comments)    Headache  . Nitrofuran Derivatives     FAMILY HISTORY: Family History  Problem Relation Age of Onset  . Other Neg Hx   . Migraines Father     SOCIAL HISTORY: History   Social History  . Marital Status: Married    Spouse Name: N/A    Number of Children: 4  . Years of Education: N/A   Occupational History  .     Social History Main Topics  . Smoking status: Never Smoker   . Smokeless tobacco: Never Used  . Alcohol Use: No  . Drug Use: No  . Sexual Activity: Not on file     Comment: BTL    Other Topics Concern  . Not on file   Social History Narrative  . No narrative on file    REVIEW OF SYSTEMS: Constitutional: No fevers, chills, or sweats, +generalized fatigue, no change in appetite Eyes: No visual changes, double vision, eye pain Ear, nose and throat: No hearing loss, ear pain, nasal congestion, sore throat Cardiovascular: No chest pain, palpitations Respiratory:  No shortness of breath at rest or with exertion, wheezes GastrointestinaI: No nausea, vomiting, diarrhea, abdominal pain, fecal incontinence Genitourinary:  No dysuria, urinary retention or frequency Musculoskeletal:  As above Integumentary: No rash, pruritus, skin lesions Neurological: as above Psychiatric: No depression, insomnia, anxiety Endocrine: No palpitations, fatigue, diaphoresis, mood swings, change in appetite, change in weight, increased thirst Hematologic/Lymphatic:  No anemia, purpura, petechiae. Allergic/Immunologic: no itchy/runny eyes, nasal congestion, recent allergic reactions, rashes  PHYSICAL EXAM: Filed Vitals:   04/15/14 1444  BP: 108/60  Pulse: 74   General: No acute distress Head:  Normocephalic/atraumatic Eyes: sharp optic discs, no vessel changes, exudates or hemorrhages seen Neck: supple, no paraspinal tenderness, full range of motion. Negative Lhermitte's  sign Back: No paraspinal tenderness Heart: regular rate and rhythm Lungs: Clear to auscultation bilaterally. Vascular: No carotid bruits. Skin/Extremities: No rash, no edema Neurological Exam: Mental status: alert and oriented to person, place, and time, no dysarthria or aphasia, Fund of knowledge is appropriate.  Recent and remote memory are intact.  Attention and concentration are normal.    Able to name objects and repeat phrases. Cranial nerves: CN I: not tested CN II: pupils equal, round and reactive to light, visual fields intact.  VA 20/20 without correction. No red color desaturation CN III, IV, VI:  full range of motion, no nystagmus, no ptosis CN V: facial sensation intact CN VII: upper and lower face symmetric CN VIII: hearing intact CN IX, X: gag intact, uvula midline CN XI: sternocleidomastoid and trapezius muscles intact CN XII: tongue midline Bulk & Tone: normal, no fasciculations. Motor: 5/5 throughout with no pronator drift. Sensation: intact to  light touch, cold, pin, vibration and joint position sense.  No extinction to double simultaneous stimulation.  Romberg test negative Deep Tendon Reflexes: brisk +3 on right UE with hyperactive pectoralis reflex and +Hoffman's sign on right.  Brisk +3 on left UE with mild pectoralis reflex, no Hoffman's sign noted.  +2 on bilateral patella, +1 bilateral ankle jerks.  No ankle clonus. Plantar responses: downgoing bilaterally Cerebellar: no incoordination on finger to nose testing Gait: narrow-based and steady, able to tandem walk adequately. Tremor: none today  IMPRESSION: This is a very pleasant 33 year old right-handed woman with no significant past medical history presenting with symptoms of shooting pains throughout her body, tremors, and numbness.  She also started having low back pain, OB workup negative.  She reports shooting neck pain a few months ago, concerning for possible Lhermitte's sign, none today.  Exam is  non-focal, however there is note of hyperactive reflexes, particularly in the right upper extremity. The etiology of her symptoms is unclear, in this age group, demyelinating disease is a concern. MRI brain with and without contrast, MRI cervical spine with and without contrast will be ordered.  A lumbar MRI without contrast will be ordered for the back pain.  She had bloodwork done through her PCP recently, this will be reviewed, and if not done, TSH, B12, ESR, ANA, vitamin D level will be ordered. She will follow-up after the MRI.  Thank you for allowing me to participate in the care of this patient. Please do not hesitate to call for any questions or concerns.   Ellouise Newer, M.D.  CC: Dr. Kevan Ny   Labs from PCP reviewed: 03/27/14- CBC, CMP normal. ESR 1, ANA negative, vitamin D 35, HbA1c 4.8.

## 2014-04-15 NOTE — Patient Instructions (Signed)
1. MRI brain with and without contrast 2. MRI cervical spine with and without contrast 3. MRI lumbar spine without contrast 4. Follow-up after MRI

## 2014-04-22 ENCOUNTER — Ambulatory Visit
Admission: RE | Admit: 2014-04-22 | Discharge: 2014-04-22 | Disposition: A | Discharge status: Home / Self Care | Payer: BC Managed Care – PPO | Source: Ambulatory Visit | Attending: Neurology | Admitting: Neurology

## 2014-04-22 DIAGNOSIS — R200 Anesthesia of skin: Secondary | ICD-10-CM

## 2014-04-22 DIAGNOSIS — M549 Dorsalgia, unspecified: Secondary | ICD-10-CM

## 2014-04-23 ENCOUNTER — Telehealth: Payer: Self-pay | Admitting: Neurology

## 2014-04-23 ENCOUNTER — Other Ambulatory Visit: Payer: Self-pay | Admitting: *Deleted

## 2014-04-23 DIAGNOSIS — R200 Anesthesia of skin: Secondary | ICD-10-CM

## 2014-04-23 LAB — VITAMIN B12: VITAMIN B 12: 465 pg/mL (ref 211–911)

## 2014-04-23 LAB — TSH: TSH: 1.183 u[IU]/mL (ref 0.350–4.500)

## 2014-04-23 NOTE — Telephone Encounter (Signed)
Labs ordered she will come by today for that. She has agreed to do the imaging if needed after the labs however she wants some medication

## 2014-04-23 NOTE — Telephone Encounter (Signed)
Please let her know I did get the labs, the only thing I would add is the TSH and vitamin B12 level. If she wants to wait for the results of those first, but I would recommend she do the scans. We can start with the MRI brain and cervical spine first, and would premedicate her in open MRI. Thanks

## 2014-04-23 NOTE — Telephone Encounter (Signed)
PATIENT WAS UNABLE TO COMPLETE HER IMAGING DUE TO HER BEING CLAUSTROPHOBIC. SHE WOULD LIKE TO KNOW IF SHE REALLY NEED THESES TEST DONE IF THERE IS ANY ADDITIONAL LAB WORK NEEDED. PLEASE ADVISE

## 2014-04-23 NOTE — Telephone Encounter (Signed)
Noted, await labs for now. thanks

## 2014-04-23 NOTE — Telephone Encounter (Signed)
Pt would like to speak with Dr. Karel JarvisAquino regarding her MRI. She stated that it did not go well.

## 2014-04-25 ENCOUNTER — Ambulatory Visit (HOSPITAL_COMMUNITY): Payer: BC Managed Care – PPO

## 2014-04-25 ENCOUNTER — Other Ambulatory Visit: Payer: Self-pay | Admitting: *Deleted

## 2014-04-25 ENCOUNTER — Ambulatory Visit: Payer: BC Managed Care – PPO | Admitting: Neurology

## 2014-04-25 NOTE — Telephone Encounter (Signed)
Pt called trying to follow up on her lab results. Please call pt.

## 2014-04-25 NOTE — Telephone Encounter (Signed)
Patient notified of her new appointment time and date. Valium call to the pharmacy

## 2014-04-27 ENCOUNTER — Other Ambulatory Visit: Payer: BC Managed Care – PPO

## 2014-04-27 ENCOUNTER — Inpatient Hospital Stay: Admission: RE | Admit: 2014-04-27 | Payer: BC Managed Care – PPO | Source: Ambulatory Visit

## 2014-04-29 ENCOUNTER — Ambulatory Visit: Payer: BC Managed Care – PPO | Admitting: Neurology

## 2014-04-30 ENCOUNTER — Other Ambulatory Visit: Payer: Self-pay | Admitting: *Deleted

## 2014-04-30 DIAGNOSIS — R200 Anesthesia of skin: Secondary | ICD-10-CM

## 2014-05-01 ENCOUNTER — Telehealth: Payer: Self-pay | Admitting: Neurology

## 2014-05-01 NOTE — Telephone Encounter (Signed)
Have received results

## 2014-05-01 NOTE — Telephone Encounter (Signed)
Pt called wanting to see if her results were ready.

## 2014-05-02 NOTE — Telephone Encounter (Signed)
Spoke to patient, she is doing well, has had occasional shooting pain, sometimes hands do the tremors, very sporadic. No similar significant symptoms, no pelvic pain.  Discussed MRI brain and cervical spine findings, no evidence of demyelinating disease, no mass lesions seen. 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 She denies any occipital headaches. Discussed that if symptoms change, she should call our office, otherwise we have agreed to hold off on MRI lumbar for now. She will f/u on a prn basis.

## 2014-05-07 ENCOUNTER — Ambulatory Visit: Payer: BC Managed Care – PPO | Admitting: Neurology

## 2014-05-16 ENCOUNTER — Encounter: Payer: Self-pay | Admitting: Neurology

## 2014-05-22 ENCOUNTER — Encounter: Payer: Self-pay | Admitting: *Deleted

## 2014-05-22 ENCOUNTER — Telehealth: Payer: Self-pay | Admitting: Neurology

## 2014-05-22 NOTE — Telephone Encounter (Signed)
Note written and left at front desk

## 2014-05-22 NOTE — Telephone Encounter (Signed)
Pt would like to speak to a nurse and obtain a letter stating how long she was under Dr. Rosalyn GessAquino's care.

## 2014-09-23 ENCOUNTER — Telehealth: Payer: Self-pay | Admitting: Neurology

## 2014-09-23 NOTE — Telephone Encounter (Signed)
Pt needs to talk to someone please call [phone]

## 2014-09-23 NOTE — Telephone Encounter (Signed)
Spoke with pt. States that some of her sxs have come back, she states they have been very mild for about a month. She has started having some shooting pains again & muscles twitches. Does she need to come back in for an ov? Please advise.

## 2014-09-23 NOTE — Telephone Encounter (Signed)
She has not been here since May 2015, yes pls schedule f/u. thanks

## 2014-09-23 NOTE — Telephone Encounter (Signed)
Noted. Appt scheduled for 10/27.

## 2014-09-30 ENCOUNTER — Encounter: Payer: Self-pay | Admitting: Neurology

## 2014-09-30 ENCOUNTER — Telehealth: Payer: Self-pay | Admitting: Family Medicine

## 2014-09-30 ENCOUNTER — Ambulatory Visit (INDEPENDENT_AMBULATORY_CARE_PROVIDER_SITE_OTHER): Payer: BC Managed Care – PPO | Admitting: Neurology

## 2014-09-30 VITALS — BP 100/74 | HR 71 | Resp 16 | Ht 64.5 in | Wt 147.0 lb

## 2014-09-30 DIAGNOSIS — R258 Other abnormal involuntary movements: Secondary | ICD-10-CM

## 2014-09-30 DIAGNOSIS — R253 Fasciculation: Secondary | ICD-10-CM | POA: Insufficient documentation

## 2014-09-30 DIAGNOSIS — R200 Anesthesia of skin: Secondary | ICD-10-CM

## 2014-09-30 LAB — RPR

## 2014-09-30 LAB — CK: Total CK: 247 U/L — ABNORMAL HIGH (ref 7–177)

## 2014-09-30 LAB — C-REACTIVE PROTEIN

## 2014-09-30 NOTE — Telephone Encounter (Signed)
Called [facility] & spoke with Para MarchJeanette to add on CK test for patient. Order also put in Epic. She will add-on test to the rest of patient's lab from today.

## 2014-09-30 NOTE — Progress Notes (Signed)
NEUROLOGY FOLLOW UP OFFICE NOTE  Michelle Curry [id number]  HISTORY OF PRESENT ILLNESS: I had the pleasure of seeing Michelle Curry in follow-up in the neurology clinic on 09/30/2014.  The patient was last seen 5 months ago for several weeks of random brief shooting pains throughout her body, mild tremors, and dizziness. 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 TSH and B12 normal. Symptoms completely resolved until 08/24/14 when she again started having very brief shooting pain initially in her left hand, then she would feel it randomly in her hand, foot, buttock region, or back, lasting for a brief second. She reports symptoms are much more mild than her initial symptoms 5 months ago. She has also noticed brief muscle twitches in her knee or back, she had eye twitching last week. She has mild numbness in her hands occurring intermittently. She only had one episode of pain in her neck when she was screaming in a football game. She has occasional headaches with sinus issues and around the time of her menstrual period. 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 No diplopia, dysarthria, dysphagia, back pain, bowel/bladder dysfunction.  HPI:  This is a very pleasant 33year old right-handed woman with a history of preeclampsia with 2 prior pregnancies, otherwise healthy, in her usual state of health until 3-1/2 weeks ago when she started having random very brief shooting pains lasting less than a second throughout her body, affecting her head, neck, cheeks, back, arms, and legs. Symptoms subsided after 4 days, then she started having severe lower back and pelvic pain, described as "like in early labor." She saw her OB who did an ultrasound which was unremarkable. The pain subsided after a  day or so. She then started having mild tremors in both thumbs, more noticeable if she would do precise movements such as putting eyeliner or moving her computer mouse. The symptoms caused her some anxiety. This lasted 2-3 days then resolved. On 05/08, she was in a store then felt very lightheaded "like floating," photosensitive, feeling that she would pass out. She went to the ER, CBC and BMP were unremarkable, but she left before being evaluated due to long wait. She went to bed and felt better the next day. She reported lip tingling and her mouth was numb for an hour. She could still move her mouth but it felt numb. She also reported intermittent tingling and numbness on the tip of her left thumb. She started having the lower back pain "like in early labor," and took Tylenol and a very hot shower that helped.   She went to the ER in September 2014 when she started having shooting pain from the back of her head up her neck when doing certain movements such as hitting a high note in church or cheering for her son. This resolved with a muscle relaxant. She has been feeling very fatigued for the past 2 years, attributed to having 4 young children. She had migraines in the past, but none recently. She denies any diplopia but feels that her vision is blurred. No loss of vision. She denies any dysarthria/dysphagia, bowel/bladder dysfunction, perineal numbness. No recent falls. She has 4 children and carries the 33year old and 33year old. There is no family history of similar symptoms.  PAST MEDICAL HISTORY: Past Medical History  Diagnosis Date  . GBS (group B streptococcus) infection 2007  . H/O varicella   . Hx of migraines   . Cystitis     x 2  . Anemia     as a teen   . Preeclampsia 06/20/2012  . Bradycardia 06/19/2012  . Preeclampsia complicating hypertension     MEDICATIONS: No current outpatient prescriptions on file prior to visit.   No current facility-administered medications on file prior  to visit.    ALLERGIES: Allergies  Allergen Reactions  . Bactrim [Sulfamethoxazole-Trimethoprim] Other (See Comments)    Flu like symptoms  . Latex Other (See Comments)    Headache  . Nitrofuran Derivatives     FAMILY HISTORY: Family History  Problem Relation Age of Onset  . Other Neg Hx   . Migraines Father     SOCIAL HISTORY: History   Social History  . Marital Status: Married    Spouse Name: N/A    Number of Children: 4  . Years of Education: N/A   Occupational History  .     Social History Main Topics  . Smoking status: Never Smoker   . Smokeless tobacco: Never Used  . Alcohol Use: No  . Drug Use: No  . Sexual Activity: Not on file     Comment: BTL    Other Topics Concern  . Not on file   Social History Narrative  . No narrative on file    REVIEW OF SYSTEMS: Constitutional: No fevers, chills, or sweats, no generalized fatigue, change in appetite Eyes: No visual changes, double vision, eye pain Ear, nose and throat: No hearing loss, ear pain, nasal congestion, sore throat Cardiovascular: No chest pain, palpitations Respiratory:  No shortness of breath at rest or with exertion, wheezes GastrointestinaI: No nausea, vomiting, diarrhea, abdominal pain, fecal incontinence Genitourinary:  No dysuria, urinary retention or frequency Musculoskeletal:  No neck pain, back pain Integumentary: No rash, pruritus, skin lesions Neurological: as above Psychiatric: No depression, insomnia, anxiety Endocrine: No palpitations, fatigue, diaphoresis, mood swings, change in appetite, change in weight, increased thirst Hematologic/Lymphatic:  No anemia, purpura, petechiae. Allergic/Immunologic: no itchy/runny eyes, nasal congestion, recent allergic reactions, rashes  PHYSICAL EXAM: Filed Vitals:   09/30/14 1042  BP: 100/74  Pulse: 71  Resp: 16   General: No acute distress  Head: Normocephalic/atraumatic  Eyes: sharp optic discs, no vessel changes, exudates or  hemorrhages seen  Neck: supple, no paraspinal tenderness, full range of motion. Negative Lhermitte's sign  Back: No paraspinal tenderness  Heart: regular rate and rhythm  Lungs: Clear to auscultation bilaterally.  Vascular: No carotid bruits.  Skin/Extremities: No rash, no edema  Neurological Exam:  Mental status: alert and oriented to person, place, and time, no dysarthria or aphasia, Fund of knowledge is appropriate. Recent and remote memory are intact. Attention and concentration are normal. Able to name objects and repeat phrases.  Cranial nerves:  CN I: not tested  CN II: pupils equal, round and reactive to light, visual fields intact. CN III, IV, VI: full range of motion, no nystagmus, no ptosis  CN V: facial sensation intact  CN VII: upper and lower face symmetric  CN VIII: hearing intact  CN IX, X: gag intact, uvula midline  CN XI: sternocleidomastoid and trapezius muscles intact  CN XII: tongue midline  Bulk & Tone: normal, no fasciculations.  Motor: 5/5 throughout with no pronator drift.  Sensation: intact to light touch, cold, pin, vibration and joint position  sense. No extinction to double simultaneous stimulation. Romberg test negative  Deep Tendon Reflexes: brisk +3 on right UE with hyperactive pectoralis reflex and +Hoffman's sign on right. Brisk +3 on left UE with mild pectoralis reflex, no Hoffman's sign noted (similar to prior). +2 on bilateral patella, +1 bilateral ankle jerks. No ankle clonus.  Plantar responses: downgoing bilaterally  Cerebellar: no incoordination on finger to nose testing  Gait: narrow-based and steady, able to tandem walk adequately.  Tremor: none today   IMPRESSION:  This is a very pleasant 33 yo RH woman with no significant past medical history presenting who presented with symptoms of shooting pains throughout her body, muscle twitches, tremors, and numbness. MRI brain and cervical spine with and without contrast normal except for 65m Chiari I  malformation. Symptoms resolved 5 months ago then recurred last month, lasting for 4 weeks. She is now back to normal, exam is again non-focal, with again note of hyperactive reflexes, particularly in the right upper extremity. The etiology of her symptoms is unclear, no evidence of demyelination on imaging. EMG/NCV of the right UE and LE will be ordered to further evaluate her symptoms. Bloodwork for ESR, CRP, ANA, ACE level, RPR, SS-A, SS-B will be ordered. She will follow-up after the EMG and knows to call our office for any change in symptoms.  Thank you for allowing me to participate in her care.  Please do not hesitate to call for any questions or concerns.  The duration of this appointment visit was 25 minutes of face-to-face time with the patient.  Greater than 50% of this time was spent in counseling, explanation of diagnosis, planning of further management, and coordination of care.   KEllouise Newer M.D.   CC: Dr. DMarlou Sa

## 2014-09-30 NOTE — Patient Instructions (Signed)
1. Bloodwork for ESR, CRP, ANA, ACE level, RPR, SS-A, SS-B 2. Schedule EMG/NCV of right UE and LE with Dr. Posey Pronto 3. Follow-up after EMG 4. Call our office for any problems

## 2014-10-01 LAB — SJOGRENS SYNDROME-A EXTRACTABLE NUCLEAR ANTIBODY: SSA (Ro) (ENA) Antibody, IgG: <1

## 2014-10-01 LAB — ANGIOTENSIN CONVERTING ENZYME: Angiotensin-Converting Enzyme: 42 U/L (ref 8–52)

## 2014-10-01 LAB — ANA: Anti Nuclear Antibody(ANA): NEGATIVE

## 2014-10-01 LAB — SJOGRENS SYNDROME-B EXTRACTABLE NUCLEAR ANTIBODY: SSB (La) (ENA) Antibody, IgG: <1

## 2014-10-01 LAB — SEDIMENTATION RATE: SED RATE: 1 mm/h (ref 0–22)

## 2014-10-06 ENCOUNTER — Encounter: Payer: Self-pay | Admitting: Neurology

## 2014-10-10 ENCOUNTER — Telehealth: Payer: Self-pay | Admitting: Neurology

## 2014-10-10 NOTE — Telephone Encounter (Signed)
Spoke to patient, she is doing good. In the morning, feels so cold. Puts space heater very close to her, that's when she sees the jumps in her legs. Otherwise has not noticed them.  Discussed bloodwork, all normal except for elevated CK, which can be seen in African Americans, most likely this is the case with her, but would proceed with EMG/NCV as planned. Discussed the test and expectations from test. Patient expressed understanding.

## 2014-11-17 ENCOUNTER — Ambulatory Visit (INDEPENDENT_AMBULATORY_CARE_PROVIDER_SITE_OTHER): Payer: BC Managed Care – PPO | Admitting: Neurology

## 2014-11-17 DIAGNOSIS — R253 Fasciculation: Secondary | ICD-10-CM

## 2014-11-17 DIAGNOSIS — R200 Anesthesia of skin: Secondary | ICD-10-CM

## 2014-11-17 NOTE — Procedures (Signed)
[facility]  [address], [address]  [place], [place] [address] Tel: [phone] Fax:  [phone] Test Date:  11/17/2014  Patient: Michelle Curry DOB: 05/13/1981 Physician: Nita Sickleonika Patel, DO  Sex: Female Height: 5\' 3"  Ref Phys: Patrcia Dollyquino, Karen  ID#: [id number] Temp: 32.0 Technician: Ala BentSusan Reid R. NCS T.   Patient Complaints: Patient is a 33 year old female here for evaluation of complaints of sporadic pains, twitching and tingling over all 4 extremities.  NCV & EMG Findings: Extensive electrodiagnostic testing of the right upper and lower extremity shows:  1. All sensory responses including the median, ulnar, radial, palmar, sural and superficial peroneal sensory nerves are within normal limits. 2. All motor responses including the median, ulnar, tibial, and peroneal nerves are within normal limits.  3. There is no evidence of active or chronic motor axon loss changes affecting any of the tested muscles. In particular, motor unit recruitment and configuration is within normal limits.  Impression: This is a normal study of the right upper and lower extremities. In particular, there is no evidence of a generalized sensorimotor polyneuropathy, carpal tunnel syndrome, or cervical/lumbosacral radiculopathy affecting the right side.   ___________________________ Nita Sickleonika Patel, DO    Nerve Conduction Studies Anti Sensory Summary Table   Site NR Peak (ms) Norm Peak (ms) P-T Amp (V) Norm P-T Amp  Right Median Anti Sensory (2nd Digit)  32C  Wrist    2.9 <3.4 39.5 >20  Right Radial Anti Sensory (Base 1st Digit)  32C  Wrist    2.2 <2.7 39.8 >18  Right Sup Peroneal Anti Sensory (Ant Lat Mall)  12 cm    2.1 <4.5 18.7 >5  Right Sural Anti Sensory (Lat Mall)  Calf    2.9 <4.5 19.4 >5  Right Ulnar Anti Sensory (5th Digit)  32C  Wrist    2.5 <3.1 49.2 >12   Motor Summary Table   Site NR Onset (ms) Norm Onset (ms) O-P Amp (mV) Norm O-P Amp Site1 Site2 Delta-0 (ms) Dist (cm)  Vel (m/s) Norm Vel (m/s)  Right Median Motor (Abd Poll Brev)  32C  Wrist    2.7 <3.9 13.8 >6 Elbow Wrist 3.8 24.0 63 >50  Elbow    6.5  13.4         Right Peroneal Motor (Ext Dig Brev)  Ankle    2.9 <5.5 5.5 >3 B Fib Ankle 5.6 31.0 55 >40  B Fib    8.5  4.8  Poplt B Fib 1.7 9.5 56 >40  Poplt    10.2  4.7         Right Peroneal TA Motor (Tib Ant)  Fib Head    1.9 <4.0 8.6 >4 Poplit Fib Head 1.6 11.0 69 >40  Poplit    3.5  8.5         Right Tibial Motor (Abd Hall Brev)  Ankle    2.5 <6.0 9.1 >8 Knee Ankle 7.5 41.0 55 >40  Knee    10.0  6.4         Right Ulnar Motor (Abd Dig Minimi)  32C  Wrist    2.1 <3.1 10.0 >7 B Elbow Wrist 3.2 21.5 67 >50  B Elbow    5.3  9.9  A Elbow B Elbow 1.7 10.0 59 >50  A Elbow    7.0  9.6          Comparison Summary Table   Site NR Peak (ms) Norm Peak (ms) P-T Amp (V) Site1  Site2 Delta-P (ms) Norm Delta (ms)  Right Median/Ulnar Palm Comparison (Wrist - 8cm)  32C  Median Palm    1.8 <2.2 47.5 Median Palm Ulnar Palm 0.1   Ulnar Palm    1.7 <2.2 36.3       EMG   Side Muscle Ins Act Fibs Psw Fasc Number Recrt Dur Dur. Amp Amp. Poly Poly. Comment  Right AntTibialis Nml Nml Nml Nml Nml Nml Nml Nml Nml Nml Nml Nml N/A  Right Gastroc Nml Nml Nml Nml Nml Nml Nml Nml Nml Nml Nml Nml N/A  Right Flex Dig Long Nml Nml Nml Nml Nml Nml Nml Nml Nml Nml Nml Nml N/A  Right RectFemoris Nml Nml Nml Nml Nml Nml Nml Nml Nml Nml Nml Nml N/A  Right GluteusMed Nml Nml Nml Nml Nml Nml Nml Nml Nml Nml Nml Nml N/A  Right 1stDorInt Nml Nml Nml Nml Nml Nml Nml Nml Nml Nml Nml Nml N/A  Right FlexPolLong Nml Nml Nml Nml Nml Nml Nml Nml Nml Nml Nml Nml N/A  Right Ext Indicis Nml Nml Nml Nml Nml Nml Nml Nml Nml Nml Nml Nml N/A  Right PronatorTeres Nml Nml Nml Nml Nml Nml Nml Nml Nml Nml Nml Nml N/A  Right Biceps Nml Nml Nml Nml Nml Nml Nml Nml Nml Nml Nml Nml N/A  Right Triceps Nml Nml Nml Nml Nml Nml Nml Nml Nml Nml Nml Nml N/A  Right Deltoid Nml Nml Nml Nml Nml Nml Nml Nml Nml  Nml Nml Nml N/A      Waveforms:

## 2014-11-19 ENCOUNTER — Ambulatory Visit (INDEPENDENT_AMBULATORY_CARE_PROVIDER_SITE_OTHER): Payer: BC Managed Care – PPO | Admitting: Neurology

## 2014-11-19 ENCOUNTER — Encounter: Payer: Self-pay | Admitting: Neurology

## 2014-11-19 VITALS — BP 112/70 | HR 78 | Resp 16 | Ht 64.5 in | Wt 151.0 lb

## 2014-11-19 DIAGNOSIS — R253 Fasciculation: Secondary | ICD-10-CM

## 2014-11-19 DIAGNOSIS — R258 Other abnormal involuntary movements: Secondary | ICD-10-CM

## 2014-11-19 DIAGNOSIS — G935 Compression of brain: Secondary | ICD-10-CM

## 2014-11-19 DIAGNOSIS — R200 Anesthesia of skin: Secondary | ICD-10-CM

## 2014-11-19 NOTE — Patient Instructions (Signed)
1. Call our office for any change in symptoms that last for more than 24hours 2. Follow-up in 1 year

## 2014-11-19 NOTE — Progress Notes (Signed)
NEUROLOGY FOLLOW UP OFFICE NOTE  Michelle Curry [id number]  HISTORY OF PRESENT ILLNESS: I had the pleasure of seeing Michelle Curry in follow-up in the neurology clinic on 11/19/2014.  The patient was last seen 2 months ago for brief electrical shocks in random parts of her body, lasting a brief second. She also noticed muscle twitches and numbness in her hands. She had left eyelid muscle twitching that resolved after 3 months. Bloodwork done on her last visit showed normal ANA, ACE, RPR, SS-A, SS-B, ESR, CRP. Her CK was elevated at 247. She had an EMG/NCV which was normal, no evidence of myopathy/myositis or neuropathy. She reports doing much better since her last visit. No headaches or neck pain. No focal weakness. No bowel/bladder dysfunction.   HPI: This is a very pleasant 33 year old right-handed woman with a history of preeclampsia with 2 prior pregnancies, otherwise healthy, in her usual state of health until May 2015 when she started having random very brief shooting pains lasting less than a second throughout her body, affecting her head, neck, cheeks, back, arms, and legs. Symptoms subsided after 4 days, then she started having severe lower back and pelvic pain, described as "like in early labor." She saw her OB who did an ultrasound which was unremarkable. The pain subsided after a day or so. She then started having mild tremors in both thumbs, more noticeable if she would do precise movements such as putting eyeliner or moving her computer mouse. The symptoms caused her some anxiety. This lasted 2-3 days then resolved. On 05/08, she was in a store then felt very lightheaded "like floating," photosensitive, feeling that she would pass out. She went to the ER, CBC and BMP were unremarkable, but she left before being evaluated due to long wait. She went to bed and felt better the next day. She reported lip tingling and her mouth was numb for an hour. She could still move her mouth but it felt numb.  She also reported intermittent tingling and numbness on the tip of her left thumb. She started having the lower back pain "like in early labor," and took Tylenol and a very hot shower that helped.   She went to the ER in September 2014 when she started having shooting pain from the back of her head up her neck when doing certain movements such as hitting a high note in church or cheering for her son. This resolved with a muscle relaxant. She has been feeling very fatigued for the past 2 years, attributed to having 4 young children. She had migraines in the past, but none recently. She denies any diplopia but feels that her vision is blurred. No loss of vision. She denies any dysarthria/dysphagia, bowel/bladder dysfunction, perineal numbness. No recent falls. She has 4 children and carries the 60 year old and 33 year old. There is no family history of similar symptoms.  Diagnostic Data: MRI brain and cervical spine with and without contrast were normal except for Chiari I malformation with cerebellar tonsils approximately 33m below the level of the foramen magnum.   PAST MEDICAL HISTORY: Past Medical History  Diagnosis Date  . GBS (group B streptococcus) infection 2007  . H/O varicella   . Hx of migraines   . Cystitis     x 2  . Anemia     as a teen   . Preeclampsia 06/20/2012  . Bradycardia 06/19/2012  . Preeclampsia complicating hypertension     MEDICATIONS: No current outpatient prescriptions  on file prior to visit.   No current facility-administered medications on file prior to visit.    ALLERGIES: Allergies  Allergen Reactions  . Bactrim [Sulfamethoxazole-Trimethoprim] Other (See Comments)    Flu like symptoms  . Latex Other (See Comments)    Headache  . Nitrofuran Derivatives     FAMILY HISTORY: Family History  Problem Relation Age of Onset  . Other Neg Hx   . Migraines Father     SOCIAL HISTORY: History   Social History  . Marital Status: Married    Spouse Name: N/A     Number of Children: 4  . Years of Education: N/A   Occupational History  .     Social History Main Topics  . Smoking status: Never Smoker   . Smokeless tobacco: Never Used  . Alcohol Use: No  . Drug Use: No  . Sexual Activity: Not on file     Comment: BTL    Other Topics Concern  . Not on file   Social History Narrative    REVIEW OF SYSTEMS: Constitutional: No fevers, chills, or sweats, no generalized fatigue, change in appetite Eyes: No visual changes, double vision, eye pain Ear, nose and throat: No hearing loss, ear pain, nasal congestion, sore throat Cardiovascular: No chest pain, palpitations Respiratory:  No shortness of breath at rest or with exertion, wheezes GastrointestinaI: No nausea, vomiting, diarrhea, abdominal pain, fecal incontinence Genitourinary:  No dysuria, urinary retention or frequency Musculoskeletal:  No neck pain, back pain Integumentary: No rash, pruritus, skin lesions Neurological: as above Psychiatric: No depression, insomnia, anxiety Endocrine: No palpitations, fatigue, diaphoresis, mood swings, change in appetite, change in weight, increased thirst Hematologic/Lymphatic:  No anemia, purpura, petechiae. Allergic/Immunologic: no itchy/runny eyes, nasal congestion, recent allergic reactions, rashes  PHYSICAL EXAM: Filed Vitals:   11/19/14 1358  BP: 112/70  Pulse: 78  Resp: 16   General: No acute distress  Head: Normocephalic/atraumatic  Eyes: sharp optic discs, no vessel changes, exudates or hemorrhages seen  Neck: supple, no paraspinal tenderness, full range of motion. Negative Lhermitte's sign  Back: No paraspinal tenderness  Heart: regular rate and rhythm  Lungs: Clear to auscultation bilaterally.  Vascular: No carotid bruits.  Skin/Extremities: No rash, no edema  Neurological Exam:  Mental status: alert and oriented to person, place, and time, no dysarthria or aphasia, Fund of knowledge is appropriate. Recent and remote  memory are intact. Attention and concentration are normal. Able to name objects and repeat phrases.  Cranial nerves:  CN I: not tested  CN II: pupils equal, round and reactive to light, visual fields intact. CN III, IV, VI: full range of motion, no nystagmus, no ptosis  CN V: facial sensation intact  CN VII: upper and lower face symmetric  CN VIII: hearing intact  CN IX, X: gag intact, uvula midline  CN XI: sternocleidomastoid and trapezius muscles intact  CN XII: tongue midline  Bulk & Tone: normal, no fasciculations.  Motor: 5/5 throughout with no pronator drift.  Sensation: intact to light touch, cold, pin, vibration and joint position sense. No extinction to double simultaneous stimulation. Romberg test negative  Deep Tendon Reflexes: brisk +3 on right UE with hyperactive pectoralis reflex and +Hoffman's sign on right. Brisk +3 on left UE with mild pectoralis reflex, no Hoffman's sign noted (similar to prior). +2 on bilateral patella, +1 bilateral ankle jerks. No ankle clonus. Plantar responses: downgoing bilaterally  Cerebellar: no incoordination on finger to nose testing  Gait: narrow-based and steady, able to tandem  walk adequately.  Tremor: none today   IMPRESSION:  This is a very pleasant 33 yo RH woman with no significant past medical history presenting who presented with symptoms of shooting pains throughout her body, muscle twitches, tremors, and numbness. MRI brain and cervical spine with and without contrast normal except for 38m Chiari I malformation. Symptoms resolved then recurred last September. These have again resolved. Her CK level was elevated, EMG/NCV was normal. Elevated CK can be seen in the African-American population, she is asymptomatic from this. The etiology of her symptoms is unclear, however all testing thus far has been unremarkable except for Chiari malformation. Exam does show hyperreflexia, no focal weakness. We have agreed to continue to  monitor her symptoms, she knows to call our office for any change in symptoms lasting more than 24 hours.  She will follow-up on an annual basis.   Thank you for allowing me to participate in her care.  Please do not hesitate to call for any questions or concerns.  The duration of this appointment visit was 15 minutes of face-to-face time with the patient.  Greater than 50% of this time was spent in counseling, explanation of diagnosis, planning of further management, and coordination of care.   Michelle Curry M.D.   CC: Dr. DMarlou Sa

## 2016-11-02 ENCOUNTER — Emergency Department (HOSPITAL_COMMUNITY): Payer: BLUE CROSS/BLUE SHIELD

## 2016-11-02 ENCOUNTER — Emergency Department (HOSPITAL_COMMUNITY)
Admission: EM | Admit: 2016-11-02 | Discharge: 2016-11-02 | Disposition: A | Discharge status: Home / Self Care | Payer: BLUE CROSS/BLUE SHIELD | Attending: Emergency Medicine | Admitting: Emergency Medicine

## 2016-11-02 ENCOUNTER — Encounter (HOSPITAL_COMMUNITY): Payer: Self-pay | Admitting: *Deleted

## 2016-11-02 DIAGNOSIS — K529 Noninfective gastroenteritis and colitis, unspecified: Secondary | ICD-10-CM | POA: Diagnosis not present

## 2016-11-02 DIAGNOSIS — R112 Nausea with vomiting, unspecified: Secondary | ICD-10-CM | POA: Diagnosis present

## 2016-11-02 DIAGNOSIS — Z91040 Latex allergy status: Secondary | ICD-10-CM | POA: Diagnosis not present

## 2016-11-02 DIAGNOSIS — R1084 Generalized abdominal pain: Secondary | ICD-10-CM | POA: Insufficient documentation

## 2016-11-02 LAB — COMPREHENSIVE METABOLIC PANEL
ALBUMIN: 4.1 g/dL (ref 3.5–5.0)
ALK PHOS: 28 U/L — AB (ref 38–126)
ALT: 13 U/L — ABNORMAL LOW (ref 14–54)
AST: 20 U/L (ref 15–41)
Anion gap: 9 (ref 5–15)
BUN: 9 mg/dL (ref 6–20)
CALCIUM: 9.2 mg/dL (ref 8.9–10.3)
CO2: 22 mmol/L (ref 22–32)
CREATININE: 0.73 mg/dL (ref 0.44–1.00)
Chloride: 108 mmol/L (ref 101–111)
GFR calc Af Amer: >60 mL/min (ref >60)
GFR calc non Af Amer: >60 mL/min (ref >60)
GLUCOSE: 126 mg/dL — AB (ref 65–99)
Potassium: 3.5 mmol/L (ref 3.5–5.1)
SODIUM: 139 mmol/L (ref 135–145)
Total Bilirubin: 0.4 mg/dL (ref 0.3–1.2)
Total Protein: 6.7 g/dL (ref 6.5–8.1)

## 2016-11-02 LAB — CBC
HCT: 38.6 % (ref 36.0–46.0)
Hemoglobin: 12.3 g/dL (ref 12.0–15.0)
MCH: 22.9 pg — AB (ref 26.0–34.0)
MCHC: 31.9 g/dL (ref 30.0–36.0)
MCV: 72 fL — AB (ref 78.0–100.0)
PLATELETS: 213 10*3/uL (ref 150–400)
RBC: 5.36 MIL/uL — ABNORMAL HIGH (ref 3.87–5.11)
RDW: 13.7 % (ref 11.5–15.5)
WBC: 15.9 10*3/uL — ABNORMAL HIGH (ref 4.0–10.5)

## 2016-11-02 LAB — URINALYSIS, ROUTINE W REFLEX MICROSCOPIC
BILIRUBIN URINE: NEGATIVE
GLUCOSE, UA: NEGATIVE mg/dL
HGB URINE DIPSTICK: NEGATIVE
Ketones, ur: NEGATIVE mg/dL
Leukocytes, UA: NEGATIVE
Nitrite: NEGATIVE
PROTEIN: NEGATIVE mg/dL
Specific Gravity, Urine: 1.03 (ref 1.005–1.030)
pH: 5 (ref 5.0–8.0)

## 2016-11-02 LAB — POC URINE PREG, ED: PREG TEST UR: NEGATIVE

## 2016-11-02 LAB — LIPASE, BLOOD: Lipase: 27 U/L (ref 11–51)

## 2016-11-02 MED ORDER — ONDANSETRON HCL 4 MG/2ML IJ SOLN
4.0000 mg | Freq: Once | INTRAMUSCULAR | Status: AC
  Administered 2016-11-02: 4 mg via INTRAVENOUS
  Filled 2016-11-02: qty 2

## 2016-11-02 MED ORDER — IOPAMIDOL (ISOVUE-300) INJECTION 61%
INTRAVENOUS | Status: AC
  Administered 2016-11-02: 100 mL via INTRAVENOUS
  Filled 2016-11-02: qty 100

## 2016-11-02 MED ORDER — ONDANSETRON 4 MG PO TBDP
4.0000 mg | ORAL_TABLET | Freq: Once | ORAL | Status: AC | PRN
  Administered 2016-11-02: 4 mg via ORAL

## 2016-11-02 MED ORDER — ONDANSETRON 4 MG PO TBDP
ORAL_TABLET | ORAL | Status: AC
  Filled 2016-11-02: qty 1

## 2016-11-02 MED ORDER — LOPERAMIDE HCL 2 MG PO CAPS: 2.0000 mg | ORAL_CAPSULE | Freq: Four times a day (QID) | ORAL | 0 refills | Status: AC | PRN

## 2016-11-02 MED ORDER — ONDANSETRON 4 MG PO TBDP: ORAL_TABLET | ORAL | 0 refills | Status: AC

## 2016-11-02 NOTE — ED Provider Notes (Signed)
[facility]-EMERGENCY DEPT Provider Note   CSN: [id number] Arrival date & time: 11/02/16  0208     History   Chief Complaint Chief Complaint  Patient presents with  . Emesis  . Diarrhea    HPI Michelle Curry is a 35 y.o. female 504 [id number] with a hx of Anemia, cystitis, migraines, BTL presents to the Emergency Department complaining of gradual, persistent, progressively worsening feeling of fullness and loss of appetite onset 2 weeks ago. Patient reports that last night around 7 PM she developed intense nausea and began to have vomiting. Patient reports vomiting every hour until her arrival in the emergency department. She reports 4 hours prior to arrival she began to have multiple bouts of loose stool. She reports generalized abdominal pain and cramping. She denies previous abdominal surgeries. She denies recent travel. Patient reports she does have B however it was checked for parvo and negative.  Patient denies recent sick contacts.  No known alleviating factors. Patient reports that movement makes her nausea worse. She denies dizziness or syncope. She reports that she has had some lightheadedness.  He denies fevers or chills, headache, neck pain, chest pain, shortness of breath, syncope, dysuria, hematuria, vaginal discharge.  The history is provided by the patient and medical records. No language interpreter was used.    Past Medical History:  Diagnosis Date  . Anemia    as a teen   . Bradycardia 06/19/2012  . Cystitis    x 2  . GBS (group B streptococcus) infection 2007  . H/O varicella   . Hx of migraines   . Preeclampsia 06/20/2012  . Preeclampsia complicating hypertension     Patient Active Problem List   Diagnosis Date Noted  . Muscle twitching 09/30/2014  . Numbness 04/15/2014  . Back pain 04/15/2014  . Preeclampsia 06/20/2012  . Bradycardia 06/19/2012  . Nuchal cord, delivered, current hospitalization 06/13/2012  . H/O Anemia 04/04/2012  . Hernia, abdominal 12/08/2011  .  Heart murmur 12/07/2011  . Migraines 12/07/2011  . Son with Arthrogryposis 12/07/2011    Past Surgical History:  Procedure Laterality Date  . NO PAST SURGERIES    . TUBAL LIGATION  06/14/2012   Procedure: POST PARTUM TUBAL LIGATION;  Surgeon: Michael LitterNaima A Dillard, MD;  Location: [facility];  Service: Gynecology;  Laterality: Bilateral;    OB History    Gravida Para Term Preterm AB Living   5 4 4   1 4    SAB TAB Ectopic Multiple Live Births   1       4       Home Medications    Prior to Admission medications   Medication Sig Start Date End Date Taking? Authorizing Provider  bismuth subsalicylate (PEPTO BISMOL) 262 MG/15ML suspension Take 30 mLs by mouth every 6 (six) hours as needed for indigestion or diarrhea or loose stools.   Yes Historical Provider, MD  ibuprofen (ADVIL,MOTRIN) 800 MG tablet Take 800 mg by mouth every 8 (eight) hours as needed for moderate pain.   Yes Historical Provider, MD  omeprazole (PRILOSEC) 20 MG capsule Take 20 mg by mouth daily as needed (heartburn).   Yes Historical Provider, MD  loperamide (IMODIUM) 2 MG capsule Take 1 capsule (2 mg total) by mouth 4 (four) times daily as needed for diarrhea or loose stools. 11/02/16   Bartolo Montanye, PA-C  ondansetron (ZOFRAN ODT) 4 MG disintegrating tablet 4mg  ODT q4 hours prn nausea/vomit 11/02/16   Dierdre ForthHannah Tate Jerkins, PA-C    Family History Family History  Problem Relation Age of Onset  . Migraines Father   . Other Neg Hx     Social History Social History  Substance Use Topics  . Smoking status: Never Smoker  . Smokeless tobacco: Never Used  . Alcohol use No     Allergies   Bactrim [sulfamethoxazole-trimethoprim]; Latex; and Nitrofuran derivatives   Review of Systems Review of Systems  Gastrointestinal: Positive for abdominal pain ( Generalized, cramping), diarrhea, nausea and vomiting.  All other systems reviewed and are negative.    Physical Exam Updated Vital Signs BP 122/85 (BP Location:  Right Arm)   Pulse 89   Temp 97.9 F (36.6 C) (Oral)   Resp 16   LMP 10/23/2016   SpO2 100%   Physical Exam  Constitutional: She appears well-developed and well-nourished. No distress.  Awake, alert, nontoxic appearance  HENT:  Head: Normocephalic and atraumatic.  Mouth/Throat: Oropharynx is clear and moist. No oropharyngeal exudate.  Eyes: Conjunctivae are normal. No scleral icterus.  Neck: Normal range of motion. Neck supple.  Cardiovascular: Normal rate, regular rhythm and intact distal pulses.   Pulmonary/Chest: Effort normal and breath sounds normal. No respiratory distress. She has no wheezes.  Equal chest expansion  Abdominal: Soft. Bowel sounds are normal. She exhibits no distension and no mass. There is generalized tenderness. There is no rigidity, no rebound, no guarding, no CVA tenderness, no tenderness at McBurney's point and negative Murphy's sign.  Musculoskeletal: Normal range of motion. She exhibits no edema.  Neurological: She is alert.  Speech is clear and goal oriented Moves extremities without ataxia  Skin: Skin is warm and dry. She is not diaphoretic.  Psychiatric: She has a normal mood and affect.  Nursing note and vitals reviewed.    ED Treatments / Results  Labs (all labs ordered are listed, but only abnormal results are displayed) Labs Reviewed  COMPREHENSIVE METABOLIC PANEL - Abnormal; Notable for the following:       Result Value   Glucose, Bld 126 (*)    ALT 13 (*)    Alkaline Phosphatase 28 (*)    All other components within normal limits  CBC - Abnormal; Notable for the following:    WBC 15.9 (*)    RBC 5.36 (*)    MCV 72.0 (*)    MCH 22.9 (*)    All other components within normal limits  LIPASE, BLOOD  URINALYSIS, ROUTINE W REFLEX MICROSCOPIC (NOT AT [facility])  POC URINE PREG, ED    Radiology Ct Abdomen Pelvis W Contrast  Result Date: 11/02/2016 CLINICAL DATA:  Acute onset of generalized abdominal pain and leukocytosis. Initial  encounter. EXAM: CT ABDOMEN AND PELVIS WITH CONTRAST TECHNIQUE: Multidetector CT imaging of the abdomen and pelvis was performed using the standard protocol following bolus administration of intravenous contrast. CONTRAST:  100 mL ISOVUE-300 IOPAMIDOL (ISOVUE-300) INJECTION 61% COMPARISON:  Abdominal radiograph performed 03/26/2009 FINDINGS: Lower chest: The visualized lung bases are grossly clear. The visualized portions of the mediastinum are unremarkable. Hepatobiliary: The liver is unremarkable in appearance. The gallbladder is unremarkable in appearance. The common bile duct remains normal in caliber. Pancreas: The pancreas is within normal limits. Spleen: A 1.7 cm cystic focus within the spleen is likely benign, given its appearance. Adrenals/Urinary Tract: The adrenal glands are unremarkable in appearance. The kidneys are within normal limits. There is no evidence of hydronephrosis. No renal or ureteral stones are identified. No perinephric stranding is seen. Stomach/Bowel: The stomach is unremarkable in appearance. The small bowel is within normal limits.  The appendix is normal in caliber, without evidence of appendicitis. The colon is largely filled with fluid, of uncertain significance. It is grossly unremarkable in appearance. Vascular/Lymphatic: The abdominal aorta is unremarkable in appearance. The inferior vena cava is grossly unremarkable. No retroperitoneal lymphadenopathy is seen. No pelvic sidewall lymphadenopathy is identified. Reproductive: The bladder is mildly distended and within normal limits. The uterus is grossly unremarkable in appearance. The ovaries are relatively symmetric. No suspicious adnexal masses are seen. Other: No additional soft tissue abnormalities are seen. Musculoskeletal: No acute osseous abnormalities are identified. The visualized musculature is unremarkable in appearance. IMPRESSION: 1. No acute abnormality seen within the abdomen or pelvis. 2. 1.7 cm cystic focus within  the spleen is likely benign, given its appearance. Electronically Signed   By: Roanna Raider M.D.   On: 11/02/2016 05:29    Procedures Procedures (including critical care time)  Medications Ordered in ED Medications  ondansetron (ZOFRAN-ODT) disintegrating tablet 4 mg (4 mg Oral Given 11/02/16 0226)  iopamidol (ISOVUE-300) 61 % injection (100 mLs Intravenous Contrast Given 11/02/16 0510)  ondansetron (ZOFRAN) injection 4 mg (4 mg Intravenous Given 11/02/16 0519)     Initial Impression / Assessment and Plan / ED Course  I have reviewed the triage vital signs and the nursing notes.  Pertinent labs & imaging results that were available during my care of the patient were reviewed by me and considered in my medical decision making (see chart for details).  Clinical Course     Patient with symptoms consistent with viral gastroenteritis.  Vitals are stable, no fever.  CT scan with fluid filled bowel but no evidence of colitis. No signs of dehydration, tolerating PO fluids > 6 oz.  Lungs are clear.  No focal abdominal pain, no concern for appendicitis, cholecystitis, pancreatitis, ruptured viscus, UTI, kidney stone, or any other abdominal etiology.  Supportive therapy indicated with return if symptoms worsen.  Patient counseled.   Final Clinical Impressions(s) / ED Diagnoses   Final diagnoses:  Gastroenteritis    New Prescriptions New Prescriptions   LOPERAMIDE (IMODIUM) 2 MG CAPSULE    Take 1 capsule (2 mg total) by mouth 4 (four) times daily as needed for diarrhea or loose stools.   ONDANSETRON (ZOFRAN ODT) 4 MG DISINTEGRATING TABLET    4mg  ODT q4 hours prn nausea/vomit     Dierdre Forth, PA-C 11/02/16 1610    Devoria Albe, MD 11/02/16 [phone]

## 2016-11-02 NOTE — ED Triage Notes (Signed)
Pt c/o abdominal pain x 2 weeks. Sudden onset of NVD 2 hours ago

## 2016-11-02 NOTE — ED Notes (Signed)
Patient Alert and oriented X4. Stable and ambulatory. Patient verbalized understanding of the discharge instructions.  Patient belongings were taken by the patient.  

## 2016-11-02 NOTE — Discharge Instructions (Signed)
1. Medications: zofran, Imodium, usual home medications 2. Treatment: rest, drink plenty of fluids, advance diet slowly 3. Follow Up: Please followup with your primary doctor in 2 days for discussion of your diagnoses and further evaluation after today's visit; if you do not have a primary care doctor use the resource guide provided to find one; Please return to the ER for persistent vomiting, high fevers or worsening symptoms

## 2017-07-07 ENCOUNTER — Other Ambulatory Visit (HOSPITAL_COMMUNITY): Payer: Self-pay | Admitting: Internal Medicine

## 2017-07-07 DIAGNOSIS — R1319 Other dysphagia: Secondary | ICD-10-CM

## 2017-07-10 ENCOUNTER — Ambulatory Visit (HOSPITAL_COMMUNITY)
Admission: RE | Admit: 2017-07-10 | Discharge: 2017-07-10 | Disposition: A | Discharge status: Home / Self Care | Payer: BLUE CROSS/BLUE SHIELD | Source: Ambulatory Visit | Attending: Internal Medicine | Admitting: Internal Medicine

## 2017-07-10 DIAGNOSIS — R109 Unspecified abdominal pain: Secondary | ICD-10-CM | POA: Diagnosis present

## 2017-07-10 DIAGNOSIS — R1319 Other dysphagia: Secondary | ICD-10-CM

## 2017-07-10 DIAGNOSIS — R1310 Dysphagia, unspecified: Secondary | ICD-10-CM | POA: Diagnosis not present

## 2017-09-15 ENCOUNTER — Inpatient Hospital Stay (HOSPITAL_COMMUNITY)
Admission: AD | Admit: 2017-09-15 | Discharge: 2017-09-16 | Disposition: A | Discharge status: Home / Self Care | Payer: BLUE CROSS/BLUE SHIELD | Source: Ambulatory Visit | Attending: Obstetrics and Gynecology | Admitting: Obstetrics and Gynecology

## 2017-09-15 DIAGNOSIS — N3001 Acute cystitis with hematuria: Secondary | ICD-10-CM

## 2017-09-15 DIAGNOSIS — N390 Urinary tract infection, site not specified: Secondary | ICD-10-CM | POA: Diagnosis not present

## 2017-09-15 DIAGNOSIS — R350 Frequency of micturition: Secondary | ICD-10-CM | POA: Diagnosis present

## 2017-09-15 LAB — URINALYSIS, ROUTINE W REFLEX MICROSCOPIC
BACTERIA UA: NONE SEEN
BILIRUBIN URINE: NEGATIVE
Glucose, UA: NEGATIVE mg/dL
KETONES UR: NEGATIVE mg/dL
NITRITE: POSITIVE — AB
Protein, ur: 100 mg/dL — AB
Specific Gravity, Urine: 1.017 (ref 1.005–1.030)
pH: 5 (ref 5.0–8.0)

## 2017-09-15 LAB — POCT PREGNANCY, URINE: PREG TEST UR: NEGATIVE

## 2017-09-15 MED ORDER — PHENAZOPYRIDINE HCL 100 MG PO TABS
200.0000 mg | ORAL_TABLET | Freq: Three times a day (TID) | ORAL | Status: DC
  Administered 2017-09-15: 200 mg via ORAL
  Filled 2017-09-15: qty 2

## 2017-09-15 MED ORDER — AMOXICILLIN-POT CLAVULANATE 875-125 MG PO TABS: 1.0000 | ORAL_TABLET | Freq: Two times a day (BID) | ORAL | 0 refills | Status: AC

## 2017-09-15 MED ORDER — PHENAZOPYRIDINE HCL 200 MG PO TABS: 200.0000 mg | ORAL_TABLET | Freq: Three times a day (TID) | ORAL | 0 refills | Status: AC

## 2017-09-15 NOTE — MAU Note (Signed)
Dx with BV yesterday-began Flagyl prescription.  After starting medication began having severe pelvic pain.  Feels like "the worst UTI ever."  Constant pain, also having urgency, hesitation and burning when she pees.  Has not taken anything for the pain.

## 2017-09-15 NOTE — MAU Note (Signed)
Chief Complaint: Pelvic Pain   First Provider Initiated Contact with Patient 09/15/17 2317     SUBJECTIVE HPI: Michelle Curry is a 36 y.o. [id number] at Unknown who presents [facility] reporting urinary frequency and urgency.  Was seen in office this week and treated for BV and yeast.  Pt states started having pain with urination today.    Location: lower pelvic pain Quality: cramping- Severity: 8/10 on pain scale Duration: today   Past Medical History:  Diagnosis Date  . Anemia    as a teen   . Bradycardia 06/19/2012  . Cystitis    x 2  . GBS (group B streptococcus) infection 2007  . H/O varicella   . Hx of migraines   . Preeclampsia 06/20/2012  . Preeclampsia complicating hypertension    OB History  Gravida Para Term Preterm AB Living  SAB TAB Ectopic Multiple Live Births  1       4    # Outcome Date GA Lbr Len/2nd Weight Sex Delivery Anes PTL Lv  5 Term 06/13/12 [redacted]w[redacted]d 04:29 / 00:20 3.83 kg (8 lb 7.1 oz) F Vag-Spont EPI  LIV  4 Term 09/2010 [redacted]w[redacted]d 24:00 4.338 kg (9 lb 9 oz) M Vag-Spont EPI N LIV     Birth Comments: pre-eclampsia  3 SAB 12/2009 [redacted]w[redacted]d   U   N DEC  2 Term 05/2006 [redacted]w[redacted]d 07:00 3.175 kg (7 lb) M Vag-Spont EPI N LIV  1 Term 2003 [redacted]w[redacted]d 08:00 3.629 kg (8 lb) M Vag-Spont EPI N LIV     Past Surgical History:  Procedure Laterality Date  . NO PAST SURGERIES    . TUBAL LIGATION  06/14/2012   Procedure: POST PARTUM TUBAL LIGATION;  Surgeon: Michael Litter, MD;  Location: [facility];  Service: Gynecology;  Laterality: Bilateral;   Social History   Social History  . Marital status: Married    Spouse name: N/A  . Number of children: 4  . Years of education: N/A   Occupational History  .  [employer]   Social History Main Topics  . Smoking status: Never Smoker  . Smokeless tobacco: Never Used  . Alcohol use No  . Drug use: No  . Sexual activity: Not on file     Comment: BTL    Other Topics Concern  . Not on file   Social History  Narrative  . No narrative on file   Family History  Problem Relation Age of Onset  . Migraines Father   . Other Neg Hx    No current facility-administered medications on file prior to encounter.    Current Outpatient Prescriptions on File Prior to Encounter  Medication Sig Dispense Refill  . bismuth subsalicylate (PEPTO BISMOL) 262 MG/15ML suspension Take 30 mLs by mouth every 6 (six) hours as needed for indigestion or diarrhea or loose stools.    Marland Kitchen ibuprofen (ADVIL,MOTRIN) 800 MG tablet Take 800 mg by mouth every 8 (eight) hours as needed for moderate pain.    Marland Kitchen loperamide (IMODIUM) 2 MG capsule Take 1 capsule (2 mg total) by mouth 4 (four) times daily as needed for diarrhea or loose stools. 12 capsule 0  . omeprazole (PRILOSEC) 20 MG capsule Take 20 mg by mouth daily as needed (heartburn).    . ondansetron (ZOFRAN ODT) 4 MG disintegrating tablet  ODT q4 hours prn nausea/vomit 15 tablet 0   Allergies  Allergen Reactions  . Bactrim [Sulfamethoxazole-Trimethoprim] Other (See Comments)  Flu like symptoms  . Latex Other (See Comments)    Headache  . Nitrofuran Derivatives Other (See Comments)    unknown    I have reviewed patient's Past Medical Hx, Surgical Hx, Family Hx, Social Hx, medications and allergies.   Review of Systems  OBJECTIVE Patient Vitals for the past 24 hrs:  BP Temp Pulse Resp Height Weight  09/15/17 2254 - 98.4 F (36.9 C) - - - -  09/15/17 2238 123/80 - 71 19  (1.6 m) 59.9 kg (132 lb)   Constitutional: Well-developed, well-nourished female in no acute distress.  Cardiovascular: normal rate Respiratory: normal rate and effort.  GI: Abd soft, slight tenderness,  MS: Extremities nontender, no edema, normal ROM Neurologic: Alert and oriented x 4.  GU: deferred LAB RESULTS Results for orders placed or performed during the hospital encounter of 09/15/17 (from the past 24 hour(s))  Urinalysis, Routine w reflex microscopic     Status: Abnormal    Collection Time: 09/15/17 10:45 PM  Result Value Ref Range   Color, Urine YELLOW YELLOW   APPearance CLOUDY (A) CLEAR   Specific Gravity, Urine 1.017 1.005 - 1.030   pH 5.0 5.0 - 8.0   Glucose, UA NEGATIVE NEGATIVE mg/dL   Hgb urine dipstick LARGE (A) NEGATIVE   Bilirubin Urine NEGATIVE NEGATIVE   Ketones, ur NEGATIVE NEGATIVE mg/dL   Protein, ur 161 (A) NEGATIVE mg/dL   Nitrite POSITIVE (A) NEGATIVE   Leukocytes, UA MODERATE (A) NEGATIVE   RBC / HPF TOO NUMEROUS TO COUNT 0 - 5 RBC/hpf   WBC, UA TOO NUMEROUS TO COUNT 0 - 5 WBC/hpf   Bacteria, UA NONE SEEN NONE SEEN   Squamous Epithelial / LPF 0-5 (A) NONE SEEN  Pregnancy, urine POC     Status: None   Collection Time: 09/15/17 10:55 PM  Result Value Ref Range   Preg Test, Ur NEGATIVE NEGATIVE    IMAGING No results found.  MAU COURSE Orders Placed This Encounter  Procedures  . OB Urine Culture  . Urinalysis, Routine w reflex microscopic  . Pregnancy, urine POC   Meds ordered this encounter  Medications  . phenazopyridine (PYRIDIUM) tablet 200 mg    MDM PE, UA and culture ASSESSMENT UTI  PLAN Pyridium  now.  Discharge home.  Antibiotic prescribed.  Due to allergies will treat with Augmentin.  Encouraged increased fluids.  Void after intercourse. Discharge home in stable condition.      Kenney Houseman, CNM 09/15/2017  11:38 PM

## 2017-09-18 LAB — CULTURE, OB URINE

## 2019-11-14 ENCOUNTER — Telehealth (HOSPITAL_COMMUNITY): Payer: Self-pay | Admitting: *Deleted

## 2019-11-14 NOTE — Telephone Encounter (Signed)
Patient was in a meeting, she will call back to schedule carotid requested by Dr. Marlou Sa

## 2019-11-20 ENCOUNTER — Other Ambulatory Visit: Payer: Self-pay

## 2019-11-20 ENCOUNTER — Other Ambulatory Visit (HOSPITAL_COMMUNITY): Payer: Self-pay | Admitting: Internal Medicine

## 2019-11-20 ENCOUNTER — Ambulatory Visit (HOSPITAL_COMMUNITY)
Admission: RE | Admit: 2019-11-20 | Discharge: 2019-11-20 | Disposition: A | Discharge status: Home / Self Care | Payer: BLUE CROSS/BLUE SHIELD | Source: Ambulatory Visit | Attending: Family | Admitting: Family

## 2019-11-20 DIAGNOSIS — R0989 Other specified symptoms and signs involving the circulatory and respiratory systems: Secondary | ICD-10-CM | POA: Insufficient documentation

## 2020-02-25 DIAGNOSIS — R0989 Other specified symptoms and signs involving the circulatory and respiratory systems: Secondary | ICD-10-CM | POA: Diagnosis not present

## 2020-02-25 DIAGNOSIS — I10 Essential (primary) hypertension: Secondary | ICD-10-CM | POA: Diagnosis not present

## 2020-02-25 DIAGNOSIS — Z8616 Personal history of COVID-19: Secondary | ICD-10-CM | POA: Diagnosis not present

## 2020-02-25 DIAGNOSIS — R768 Other specified abnormal immunological findings in serum: Secondary | ICD-10-CM | POA: Diagnosis not present

## 2020-05-19 DIAGNOSIS — D649 Anemia, unspecified: Secondary | ICD-10-CM | POA: Diagnosis not present

## 2020-05-19 DIAGNOSIS — D509 Iron deficiency anemia, unspecified: Secondary | ICD-10-CM | POA: Diagnosis not present

## 2020-05-19 DIAGNOSIS — I10 Essential (primary) hypertension: Secondary | ICD-10-CM | POA: Diagnosis not present

## 2020-05-19 DIAGNOSIS — E559 Vitamin D deficiency, unspecified: Secondary | ICD-10-CM | POA: Diagnosis not present

## 2020-05-19 DIAGNOSIS — K219 Gastro-esophageal reflux disease without esophagitis: Secondary | ICD-10-CM | POA: Diagnosis not present

## 2020-05-19 DIAGNOSIS — Z79899 Other long term (current) drug therapy: Secondary | ICD-10-CM | POA: Diagnosis not present

## 2020-06-03 DIAGNOSIS — N943 Premenstrual tension syndrome: Secondary | ICD-10-CM | POA: Diagnosis not present

## 2020-06-03 DIAGNOSIS — Z3040 Encounter for surveillance of contraceptives, unspecified: Secondary | ICD-10-CM | POA: Diagnosis not present

## 2020-06-03 DIAGNOSIS — Z01419 Encounter for gynecological examination (general) (routine) without abnormal findings: Secondary | ICD-10-CM | POA: Diagnosis not present

## 2020-06-03 DIAGNOSIS — Z6825 Body mass index (BMI) 25.0-25.9, adult: Secondary | ICD-10-CM | POA: Diagnosis not present

## 2020-08-20 DIAGNOSIS — E559 Vitamin D deficiency, unspecified: Secondary | ICD-10-CM | POA: Diagnosis not present

## 2020-08-20 DIAGNOSIS — D509 Iron deficiency anemia, unspecified: Secondary | ICD-10-CM | POA: Diagnosis not present

## 2020-08-20 DIAGNOSIS — R0989 Other specified symptoms and signs involving the circulatory and respiratory systems: Secondary | ICD-10-CM | POA: Diagnosis not present

## 2020-08-20 DIAGNOSIS — Z23 Encounter for immunization: Secondary | ICD-10-CM | POA: Diagnosis not present

## 2020-08-20 DIAGNOSIS — D649 Anemia, unspecified: Secondary | ICD-10-CM | POA: Diagnosis not present

## 2020-08-20 DIAGNOSIS — R718 Other abnormality of red blood cells: Secondary | ICD-10-CM | POA: Diagnosis not present

## 2020-08-20 DIAGNOSIS — I10 Essential (primary) hypertension: Secondary | ICD-10-CM | POA: Diagnosis not present

## 2020-08-27 ENCOUNTER — Other Ambulatory Visit: Payer: Self-pay | Admitting: Internal Medicine

## 2020-08-27 DIAGNOSIS — H93A9 Pulsatile tinnitus, unspecified ear: Secondary | ICD-10-CM

## 2020-09-04 ENCOUNTER — Other Ambulatory Visit: Payer: Self-pay

## 2020-09-04 ENCOUNTER — Ambulatory Visit
Admission: RE | Admit: 2020-09-04 | Discharge: 2020-09-04 | Disposition: A | Discharge status: Home / Self Care | Payer: BLUE CROSS/BLUE SHIELD | Source: Ambulatory Visit | Attending: Internal Medicine | Admitting: Internal Medicine

## 2020-09-04 DIAGNOSIS — H93A9 Pulsatile tinnitus, unspecified ear: Secondary | ICD-10-CM | POA: Diagnosis not present

## 2020-11-03 DIAGNOSIS — H9312 Tinnitus, left ear: Secondary | ICD-10-CM | POA: Diagnosis not present

## 2021-01-22 DIAGNOSIS — R5383 Other fatigue: Secondary | ICD-10-CM | POA: Diagnosis not present

## 2021-01-22 DIAGNOSIS — I10 Essential (primary) hypertension: Secondary | ICD-10-CM | POA: Diagnosis not present

## 2021-01-22 DIAGNOSIS — I7300 Raynaud's syndrome without gangrene: Secondary | ICD-10-CM | POA: Diagnosis not present

## 2021-01-22 DIAGNOSIS — E611 Iron deficiency: Secondary | ICD-10-CM | POA: Diagnosis not present

## 2021-01-22 DIAGNOSIS — D509 Iron deficiency anemia, unspecified: Secondary | ICD-10-CM | POA: Diagnosis not present

## 2021-06-08 DIAGNOSIS — Z6825 Body mass index (BMI) 25.0-25.9, adult: Secondary | ICD-10-CM | POA: Diagnosis not present

## 2021-06-08 DIAGNOSIS — Z01419 Encounter for gynecological examination (general) (routine) without abnormal findings: Secondary | ICD-10-CM | POA: Diagnosis not present

## 2021-06-08 DIAGNOSIS — Z3040 Encounter for surveillance of contraceptives, unspecified: Secondary | ICD-10-CM | POA: Diagnosis not present

## 2021-07-07 DIAGNOSIS — R102 Pelvic and perineal pain: Secondary | ICD-10-CM | POA: Diagnosis not present

## 2021-07-07 DIAGNOSIS — M545 Low back pain, unspecified: Secondary | ICD-10-CM | POA: Diagnosis not present

## 2021-09-10 DIAGNOSIS — Z1231 Encounter for screening mammogram for malignant neoplasm of breast: Secondary | ICD-10-CM | POA: Diagnosis not present

## 2021-11-23 DIAGNOSIS — D509 Iron deficiency anemia, unspecified: Secondary | ICD-10-CM | POA: Diagnosis not present

## 2021-11-23 DIAGNOSIS — R002 Palpitations: Secondary | ICD-10-CM | POA: Diagnosis not present

## 2021-11-23 DIAGNOSIS — R0789 Other chest pain: Secondary | ICD-10-CM | POA: Diagnosis not present

## 2021-11-23 DIAGNOSIS — E559 Vitamin D deficiency, unspecified: Secondary | ICD-10-CM | POA: Diagnosis not present

## 2021-11-23 DIAGNOSIS — I10 Essential (primary) hypertension: Secondary | ICD-10-CM | POA: Diagnosis not present

## 2021-11-23 DIAGNOSIS — J302 Other seasonal allergic rhinitis: Secondary | ICD-10-CM | POA: Diagnosis not present

## 2022-01-11 DIAGNOSIS — M76891 Other specified enthesopathies of right lower limb, excluding foot: Secondary | ICD-10-CM | POA: Diagnosis not present

## 2022-01-11 DIAGNOSIS — M76892 Other specified enthesopathies of left lower limb, excluding foot: Secondary | ICD-10-CM | POA: Diagnosis not present

## 2022-01-12 ENCOUNTER — Ambulatory Visit (INDEPENDENT_AMBULATORY_CARE_PROVIDER_SITE_OTHER): Payer: BLUE CROSS/BLUE SHIELD | Admitting: Family Medicine

## 2022-01-12 ENCOUNTER — Encounter: Payer: Self-pay | Admitting: Family Medicine

## 2022-01-12 VITALS — BP 110/84 | Ht 63.0 in | Wt 142.0 lb

## 2022-01-12 DIAGNOSIS — M2141 Flat foot [pes planus] (acquired), right foot: Secondary | ICD-10-CM

## 2022-01-12 DIAGNOSIS — R269 Unspecified abnormalities of gait and mobility: Secondary | ICD-10-CM | POA: Diagnosis not present

## 2022-01-12 DIAGNOSIS — M2142 Flat foot [pes planus] (acquired), left foot: Secondary | ICD-10-CM

## 2022-01-12 DIAGNOSIS — M25561 Pain in right knee: Secondary | ICD-10-CM | POA: Diagnosis not present

## 2022-01-12 DIAGNOSIS — M25562 Pain in left knee: Secondary | ICD-10-CM

## 2022-01-12 NOTE — Progress Notes (Signed)
PCP: Gwenyth Bender, MD  Subjective:   HPI: Patient is a 41 y.o. female here for bilateral knee pain.  Patient recently seen at [facility] for bilateral anterior knee pain by Dr. Thurston Hole. Evaluation and x-rays were reassuring but noted significant pes planus. Referred here for gait evaluation and for possible inserts. She is a runner but noticed for past 2-3 weeks pain that started with tightness in achilles tendons that improved. Pain since then after she runs occurs bilateral anterior knees. No swelling or bruising. Ok while she is running. Tried different shoes. No pain at rest or today.  Past Medical History:  Diagnosis Date   Anemia    as a teen    Bradycardia 06/19/2012   Cystitis    x 2   GBS (group B streptococcus) infection 2007   H/O varicella    Hx of migraines    Preeclampsia 06/20/2012   Preeclampsia complicating hypertension     Current Outpatient Medications on File Prior to Visit  Medication Sig Dispense Refill   bismuth subsalicylate (PEPTO BISMOL) 262 MG/15ML suspension Take 30 mLs by mouth every 6 (six) hours as needed for indigestion or diarrhea or loose stools.     ibuprofen (ADVIL,MOTRIN) 800 MG tablet Take 800 mg by mouth every 8 (eight) hours as needed for moderate pain.     loperamide (IMODIUM) 2 MG capsule Take 1 capsule (2 mg total) by mouth 4 (four) times daily as needed for diarrhea or loose stools. 12 capsule 0   omeprazole (PRILOSEC) 20 MG capsule Take 20 mg by mouth daily as needed (heartburn).     ondansetron (ZOFRAN ODT) 4 MG disintegrating tablet 4mg  ODT q4 hours prn nausea/vomit 15 tablet 0   No current facility-administered medications on file prior to visit.    Past Surgical History:  Procedure Laterality Date   NO PAST SURGERIES     TUBAL LIGATION  06/14/2012   Procedure: POST PARTUM TUBAL LIGATION;  Surgeon: 08/15/2012, MD;  Location: [facility];  Service: Gynecology;  Laterality: Bilateral;    Allergies  Allergen Reactions    Bactrim [Sulfamethoxazole-Trimethoprim] Other (See Comments)    Flu like symptoms   Latex Other (See Comments)    Headache   Nitrofuran Derivatives Other (See Comments)    unknown    BP 110/84    Ht 5\' 3"  (1.6 m)    Wt 142 lb (64.4 kg)    BMI 25.15 kg/m   [facility] 01/12/2022  Frequency of aerobic exercise (# of days/week) 5  Average time in minutes 45  Frequency of strengthening activities (# of days/week) 2    No flowsheet data found.      Objective:  Physical Exam:  Gen: NAD, comfortable in exam room  Right knee: No gross deformity, ecchymoses, swelling.  Mild VMO atrophy No TTP. FROM with normal strength except 5-/5 hip abduction. Negative ant/post drawers. Negative valgus/varus testing. Negative lachman.  Negative mcmurrays, apleys.  NV intact distally.  Left knee: No gross deformity, ecchymoses, swelling.  Mild VMO atrophy No TTP. FROM with normal strength except 4/5 hip abduction. Negative ant/post drawers. Negative valgus/varus testing. Negative lachman.  Negative mcmurrays, apleys.  NV intact distally.  Bilateral feet: Pes planus.  Transverse arch collapse without abnormal callus but small bunions.  Gait: forefoot striker.  Overpronation.  Excessive patellar motion bilaterally.   Assessment & Plan:  1. Bilateral knee pain - 2/2 patellofemoral syndrome.  VMO, hip abduction strengthening, sports insoles with scaphoid pads  for overpronation.  Consider evaluation at [facility] in future.  Consider custom orthotics.

## 2022-01-12 NOTE — Patient Instructions (Signed)
You have patellofemoral syndrome. Avoid painful activities when possible (often deep squats, lunges, leg press bother this). Cross train with swimming, cycling with low resistance, elliptical if needed especially the next 2 weeks. But like I said you are not damaging anything when you're running even if it's painful afterwards. Straight leg raise, hip side raises, straight leg raises with foot turned outwards 3 sets of 10 once a day. Add ankle weight if these become too easy. Consider formal physical therapy. Correct foot breakdown with something like dr. Zoe Lan active series, spencos, or our green sports insoles - you can move these into different shoes. Avoid flat shoes, barefoot walking as much as possible. Icing 15 minutes at a time 3-4 times a day as needed. Tylenol or ibuprofen as needed for pain. Follow up with me in 1 month.

## 2022-02-09 ENCOUNTER — Ambulatory Visit: Payer: BLUE CROSS/BLUE SHIELD | Admitting: Family Medicine

## 2022-06-28 DIAGNOSIS — Z1231 Encounter for screening mammogram for malignant neoplasm of breast: Secondary | ICD-10-CM | POA: Diagnosis not present

## 2022-06-28 DIAGNOSIS — Z6826 Body mass index (BMI) 26.0-26.9, adult: Secondary | ICD-10-CM | POA: Diagnosis not present

## 2022-06-28 DIAGNOSIS — Z01419 Encounter for gynecological examination (general) (routine) without abnormal findings: Secondary | ICD-10-CM | POA: Diagnosis not present

## 2022-07-13 DIAGNOSIS — R059 Cough, unspecified: Secondary | ICD-10-CM | POA: Diagnosis not present

## 2022-07-13 DIAGNOSIS — R5383 Other fatigue: Secondary | ICD-10-CM | POA: Diagnosis not present

## 2022-07-13 DIAGNOSIS — I10 Essential (primary) hypertension: Secondary | ICD-10-CM | POA: Diagnosis not present

## 2022-07-13 DIAGNOSIS — J029 Acute pharyngitis, unspecified: Secondary | ICD-10-CM | POA: Diagnosis not present

## 2022-07-13 DIAGNOSIS — J312 Chronic pharyngitis: Secondary | ICD-10-CM | POA: Diagnosis not present

## 2022-07-13 DIAGNOSIS — R519 Headache, unspecified: Secondary | ICD-10-CM | POA: Diagnosis not present

## 2022-07-13 DIAGNOSIS — J302 Other seasonal allergic rhinitis: Secondary | ICD-10-CM | POA: Diagnosis not present

## 2022-07-13 DIAGNOSIS — J321 Chronic frontal sinusitis: Secondary | ICD-10-CM | POA: Diagnosis not present

## 2022-07-28 DIAGNOSIS — E559 Vitamin D deficiency, unspecified: Secondary | ICD-10-CM | POA: Diagnosis not present

## 2022-07-28 DIAGNOSIS — D509 Iron deficiency anemia, unspecified: Secondary | ICD-10-CM | POA: Diagnosis not present

## 2022-07-28 DIAGNOSIS — J321 Chronic frontal sinusitis: Secondary | ICD-10-CM | POA: Diagnosis not present

## 2022-07-28 DIAGNOSIS — J302 Other seasonal allergic rhinitis: Secondary | ICD-10-CM | POA: Diagnosis not present

## 2022-07-28 DIAGNOSIS — K219 Gastro-esophageal reflux disease without esophagitis: Secondary | ICD-10-CM | POA: Diagnosis not present

## 2022-07-28 DIAGNOSIS — I10 Essential (primary) hypertension: Secondary | ICD-10-CM | POA: Diagnosis not present

## 2022-11-15 DIAGNOSIS — I10 Essential (primary) hypertension: Secondary | ICD-10-CM | POA: Diagnosis not present

## 2022-11-15 DIAGNOSIS — D509 Iron deficiency anemia, unspecified: Secondary | ICD-10-CM | POA: Diagnosis not present

## 2022-11-15 DIAGNOSIS — E559 Vitamin D deficiency, unspecified: Secondary | ICD-10-CM | POA: Diagnosis not present

## 2022-11-15 DIAGNOSIS — J302 Other seasonal allergic rhinitis: Secondary | ICD-10-CM | POA: Diagnosis not present

## 2022-11-15 DIAGNOSIS — K219 Gastro-esophageal reflux disease without esophagitis: Secondary | ICD-10-CM | POA: Diagnosis not present

## 2023-02-07 DIAGNOSIS — Z1231 Encounter for screening mammogram for malignant neoplasm of breast: Secondary | ICD-10-CM | POA: Diagnosis not present

## 2023-02-14 DIAGNOSIS — K219 Gastro-esophageal reflux disease without esophagitis: Secondary | ICD-10-CM | POA: Diagnosis not present

## 2023-02-14 DIAGNOSIS — E559 Vitamin D deficiency, unspecified: Secondary | ICD-10-CM | POA: Diagnosis not present

## 2023-02-14 DIAGNOSIS — J302 Other seasonal allergic rhinitis: Secondary | ICD-10-CM | POA: Diagnosis not present

## 2023-02-14 DIAGNOSIS — Z0000 Encounter for general adult medical examination without abnormal findings: Secondary | ICD-10-CM | POA: Diagnosis not present

## 2023-02-14 DIAGNOSIS — D509 Iron deficiency anemia, unspecified: Secondary | ICD-10-CM | POA: Diagnosis not present

## 2023-02-14 DIAGNOSIS — I10 Essential (primary) hypertension: Secondary | ICD-10-CM | POA: Diagnosis not present

## 2023-06-20 DIAGNOSIS — I119 Hypertensive heart disease without heart failure: Secondary | ICD-10-CM | POA: Diagnosis not present

## 2023-06-20 DIAGNOSIS — K219 Gastro-esophageal reflux disease without esophagitis: Secondary | ICD-10-CM | POA: Diagnosis not present

## 2023-06-20 DIAGNOSIS — E559 Vitamin D deficiency, unspecified: Secondary | ICD-10-CM | POA: Diagnosis not present

## 2023-06-20 DIAGNOSIS — E785 Hyperlipidemia, unspecified: Secondary | ICD-10-CM | POA: Diagnosis not present

## 2023-06-20 DIAGNOSIS — J302 Other seasonal allergic rhinitis: Secondary | ICD-10-CM | POA: Diagnosis not present

## 2023-06-20 DIAGNOSIS — R0789 Other chest pain: Secondary | ICD-10-CM | POA: Diagnosis not present

## 2023-06-20 DIAGNOSIS — D509 Iron deficiency anemia, unspecified: Secondary | ICD-10-CM | POA: Diagnosis not present

## 2023-07-11 DIAGNOSIS — Z01419 Encounter for gynecological examination (general) (routine) without abnormal findings: Secondary | ICD-10-CM | POA: Diagnosis not present

## 2023-10-30 DIAGNOSIS — H16223 Keratoconjunctivitis sicca, not specified as Sjogren's, bilateral: Secondary | ICD-10-CM | POA: Diagnosis not present

## 2023-10-30 DIAGNOSIS — H0288B Meibomian gland dysfunction left eye, upper and lower eyelids: Secondary | ICD-10-CM | POA: Diagnosis not present

## 2023-10-30 DIAGNOSIS — H0288A Meibomian gland dysfunction right eye, upper and lower eyelids: Secondary | ICD-10-CM | POA: Diagnosis not present

## 2023-11-16 DIAGNOSIS — D509 Iron deficiency anemia, unspecified: Secondary | ICD-10-CM | POA: Diagnosis not present

## 2023-11-16 DIAGNOSIS — K219 Gastro-esophageal reflux disease without esophagitis: Secondary | ICD-10-CM | POA: Diagnosis not present

## 2023-11-16 DIAGNOSIS — E559 Vitamin D deficiency, unspecified: Secondary | ICD-10-CM | POA: Diagnosis not present

## 2023-11-16 DIAGNOSIS — J302 Other seasonal allergic rhinitis: Secondary | ICD-10-CM | POA: Diagnosis not present

## 2023-11-16 DIAGNOSIS — I119 Hypertensive heart disease without heart failure: Secondary | ICD-10-CM | POA: Diagnosis not present

## 2023-11-20 DIAGNOSIS — R079 Chest pain, unspecified: Secondary | ICD-10-CM | POA: Diagnosis not present

## 2024-02-14 DIAGNOSIS — M25562 Pain in left knee: Secondary | ICD-10-CM | POA: Diagnosis not present

## 2024-03-08 DIAGNOSIS — E559 Vitamin D deficiency, unspecified: Secondary | ICD-10-CM | POA: Diagnosis not present

## 2024-03-08 DIAGNOSIS — R531 Weakness: Secondary | ICD-10-CM | POA: Diagnosis not present

## 2024-03-08 DIAGNOSIS — D539 Nutritional anemia, unspecified: Secondary | ICD-10-CM | POA: Diagnosis not present

## 2024-03-08 DIAGNOSIS — D509 Iron deficiency anemia, unspecified: Secondary | ICD-10-CM | POA: Diagnosis not present

## 2024-03-16 DIAGNOSIS — E559 Vitamin D deficiency, unspecified: Secondary | ICD-10-CM | POA: Diagnosis not present

## 2024-03-16 DIAGNOSIS — D509 Iron deficiency anemia, unspecified: Secondary | ICD-10-CM | POA: Diagnosis not present

## 2024-03-16 DIAGNOSIS — M25562 Pain in left knee: Secondary | ICD-10-CM | POA: Diagnosis not present

## 2024-03-16 DIAGNOSIS — I119 Hypertensive heart disease without heart failure: Secondary | ICD-10-CM | POA: Diagnosis not present

## 2024-05-09 ENCOUNTER — Other Ambulatory Visit: Payer: Self-pay | Admitting: Obstetrics and Gynecology

## 2024-05-09 DIAGNOSIS — Z1231 Encounter for screening mammogram for malignant neoplasm of breast: Secondary | ICD-10-CM

## 2024-06-18 ENCOUNTER — Ambulatory Visit

## 2024-06-19 ENCOUNTER — Ambulatory Visit
Admission: RE | Admit: 2024-06-19 | Discharge: 2024-06-19 | Disposition: A | Discharge status: Home / Self Care | Source: Ambulatory Visit | Attending: Obstetrics and Gynecology | Admitting: Obstetrics and Gynecology

## 2024-06-19 DIAGNOSIS — Z1231 Encounter for screening mammogram for malignant neoplasm of breast: Secondary | ICD-10-CM

## 2024-06-21 DIAGNOSIS — L638 Other alopecia areata: Secondary | ICD-10-CM | POA: Diagnosis not present

## 2024-06-21 DIAGNOSIS — L80 Vitiligo: Secondary | ICD-10-CM | POA: Diagnosis not present

## 2024-06-21 DIAGNOSIS — L2089 Other atopic dermatitis: Secondary | ICD-10-CM | POA: Diagnosis not present

## 2024-06-24 DIAGNOSIS — L638 Other alopecia areata: Secondary | ICD-10-CM | POA: Diagnosis not present

## 2024-07-16 DIAGNOSIS — N841 Polyp of cervix uteri: Secondary | ICD-10-CM | POA: Diagnosis not present

## 2024-07-16 DIAGNOSIS — Z01419 Encounter for gynecological examination (general) (routine) without abnormal findings: Secondary | ICD-10-CM | POA: Diagnosis not present

## 2024-07-16 DIAGNOSIS — Z124 Encounter for screening for malignant neoplasm of cervix: Secondary | ICD-10-CM | POA: Diagnosis not present

## 2024-07-16 DIAGNOSIS — Z1330 Encounter for screening examination for mental health and behavioral disorders, unspecified: Secondary | ICD-10-CM | POA: Diagnosis not present

## 2024-07-18 DIAGNOSIS — L80 Vitiligo: Secondary | ICD-10-CM | POA: Diagnosis not present

## 2024-07-18 DIAGNOSIS — L2089 Other atopic dermatitis: Secondary | ICD-10-CM | POA: Diagnosis not present

## 2024-07-18 DIAGNOSIS — L638 Other alopecia areata: Secondary | ICD-10-CM | POA: Diagnosis not present

## 2024-07-23 DIAGNOSIS — D509 Iron deficiency anemia, unspecified: Secondary | ICD-10-CM | POA: Diagnosis not present

## 2024-07-23 DIAGNOSIS — E559 Vitamin D deficiency, unspecified: Secondary | ICD-10-CM | POA: Diagnosis not present

## 2024-07-23 DIAGNOSIS — M25562 Pain in left knee: Secondary | ICD-10-CM | POA: Diagnosis not present

## 2024-07-23 DIAGNOSIS — I119 Hypertensive heart disease without heart failure: Secondary | ICD-10-CM | POA: Diagnosis not present

## 2024-07-23 DIAGNOSIS — Z13220 Encounter for screening for lipoid disorders: Secondary | ICD-10-CM | POA: Diagnosis not present

## 2024-08-06 DIAGNOSIS — R2242 Localized swelling, mass and lump, left lower limb: Secondary | ICD-10-CM | POA: Diagnosis not present

## 2024-08-06 DIAGNOSIS — M25561 Pain in right knee: Secondary | ICD-10-CM | POA: Diagnosis not present

## 2024-08-06 DIAGNOSIS — M25562 Pain in left knee: Secondary | ICD-10-CM | POA: Diagnosis not present

## 2024-08-09 DIAGNOSIS — M25561 Pain in right knee: Secondary | ICD-10-CM | POA: Diagnosis not present

## 2024-08-09 DIAGNOSIS — M25562 Pain in left knee: Secondary | ICD-10-CM | POA: Diagnosis not present

## 2024-08-09 DIAGNOSIS — R2242 Localized swelling, mass and lump, left lower limb: Secondary | ICD-10-CM | POA: Diagnosis not present

## 2024-08-13 DIAGNOSIS — M79662 Pain in left lower leg: Secondary | ICD-10-CM | POA: Diagnosis not present

## 2024-08-19 DIAGNOSIS — I119 Hypertensive heart disease without heart failure: Secondary | ICD-10-CM | POA: Diagnosis not present

## 2024-08-19 DIAGNOSIS — Z23 Encounter for immunization: Secondary | ICD-10-CM | POA: Diagnosis not present

## 2024-08-19 DIAGNOSIS — K219 Gastro-esophageal reflux disease without esophagitis: Secondary | ICD-10-CM | POA: Diagnosis not present

## 2024-08-19 DIAGNOSIS — Z0001 Encounter for general adult medical examination with abnormal findings: Secondary | ICD-10-CM | POA: Diagnosis not present

## 2024-08-19 DIAGNOSIS — D509 Iron deficiency anemia, unspecified: Secondary | ICD-10-CM | POA: Diagnosis not present

## 2024-08-20 DIAGNOSIS — M25562 Pain in left knee: Secondary | ICD-10-CM | POA: Diagnosis not present
